# Patient Record
Sex: Female | Born: 1985 | Race: Black or African American | Hispanic: No | Marital: Single | Smoking: Never smoker
Health system: Southern US, Community
[De-identification: ages and names within clinical notes are randomized; demographics above are authoritative.]

## PROBLEM LIST (undated history)

## (undated) DIAGNOSIS — J45909 Unspecified asthma, uncomplicated: Secondary | ICD-10-CM

## (undated) DIAGNOSIS — F419 Anxiety disorder, unspecified: Secondary | ICD-10-CM

## (undated) DIAGNOSIS — G43909 Migraine, unspecified, not intractable, without status migrainosus: Secondary | ICD-10-CM

## (undated) DIAGNOSIS — IMO0002 Reserved for concepts with insufficient information to code with codable children: Secondary | ICD-10-CM

## (undated) DIAGNOSIS — F32A Depression, unspecified: Secondary | ICD-10-CM

## (undated) DIAGNOSIS — K219 Gastro-esophageal reflux disease without esophagitis: Secondary | ICD-10-CM

## (undated) HISTORY — DX: Anxiety disorder, unspecified: F41.9

## (undated) HISTORY — PX: TONSILLECTOMY: SUR1361

## (undated) HISTORY — DX: Migraine, unspecified, not intractable, without status migrainosus: G43.909

## (undated) HISTORY — DX: Depression, unspecified: F32.A

## (undated) HISTORY — DX: Reserved for concepts with insufficient information to code with codable children: IMO0002

## (undated) HISTORY — PX: DENTAL RESTORATION/EXTRACTION WITH X-RAY: SHX5796

---

## 2009-07-11 DIAGNOSIS — IMO0002 Reserved for concepts with insufficient information to code with codable children: Secondary | ICD-10-CM

## 2009-07-11 HISTORY — PX: COLPOSCOPY: SHX161

## 2009-07-11 HISTORY — DX: Reserved for concepts with insufficient information to code with codable children: IMO0002

## 2010-06-11 ENCOUNTER — Ambulatory Visit: Payer: Self-pay | Admitting: Gynecology

## 2010-11-11 ENCOUNTER — Ambulatory Visit: Payer: Self-pay | Admitting: Gynecology

## 2010-11-15 ENCOUNTER — Ambulatory Visit (INDEPENDENT_AMBULATORY_CARE_PROVIDER_SITE_OTHER): Payer: 59 | Admitting: Gynecology

## 2010-11-15 DIAGNOSIS — O200 Threatened abortion: Secondary | ICD-10-CM

## 2010-11-29 ENCOUNTER — Inpatient Hospital Stay (HOSPITAL_COMMUNITY)
Admission: AD | Admit: 2010-11-29 | Discharge: 2010-11-29 | Disposition: A | Discharge status: Home / Self Care | Payer: 59 | Source: Ambulatory Visit | Attending: Obstetrics & Gynecology | Admitting: Obstetrics & Gynecology

## 2010-11-29 ENCOUNTER — Inpatient Hospital Stay (HOSPITAL_COMMUNITY): Payer: 59

## 2010-11-29 DIAGNOSIS — O209 Hemorrhage in early pregnancy, unspecified: Secondary | ICD-10-CM | POA: Insufficient documentation

## 2010-11-29 LAB — URINALYSIS, ROUTINE W REFLEX MICROSCOPIC
Glucose, UA: NEGATIVE mg/dL
Ketones, ur: NEGATIVE mg/dL
Protein, ur: NEGATIVE mg/dL
Urobilinogen, UA: 0.2 mg/dL (ref 0.0–1.0)

## 2010-11-29 LAB — CBC
HCT: 31.3 % — ABNORMAL LOW (ref 36.0–46.0)
MCV: 89.7 fL (ref 78.0–100.0)
Platelets: 316 10*3/uL (ref 150–400)
RBC: 3.49 MIL/uL — ABNORMAL LOW (ref 3.87–5.11)
RDW: 12.6 % (ref 11.5–15.5)
WBC: 9.6 10*3/uL (ref 4.0–10.5)

## 2010-11-29 LAB — WET PREP, GENITAL: Yeast Wet Prep HPF POC: NONE SEEN

## 2010-11-29 LAB — URINE MICROSCOPIC-ADD ON

## 2010-11-30 LAB — GC/CHLAMYDIA PROBE AMP, GENITAL: Chlamydia, DNA Probe: NEGATIVE

## 2010-11-30 LAB — URINE CULTURE
Colony Count: NO GROWTH
Culture: NO GROWTH

## 2010-11-30 LAB — RH IMMUNE GLOBULIN WORKUP (NOT WOMEN'S HOSP)
Antibody Screen: NEGATIVE
Unit division: 0

## 2010-12-02 ENCOUNTER — Other Ambulatory Visit: Payer: Self-pay | Admitting: Obstetrics & Gynecology

## 2010-12-02 ENCOUNTER — Inpatient Hospital Stay (HOSPITAL_COMMUNITY)
Admission: AD | Admit: 2010-12-02 | Discharge: 2010-12-03 | DRG: 779 | Disposition: A | Discharge status: Home / Self Care | Payer: 59 | Source: Ambulatory Visit | Attending: Obstetrics & Gynecology | Admitting: Obstetrics & Gynecology

## 2010-12-02 DIAGNOSIS — D62 Acute posthemorrhagic anemia: Secondary | ICD-10-CM | POA: Diagnosis present

## 2010-12-02 DIAGNOSIS — O99019 Anemia complicating pregnancy, unspecified trimester: Secondary | ICD-10-CM | POA: Diagnosis present

## 2010-12-02 DIAGNOSIS — O4590 Premature separation of placenta, unspecified, unspecified trimester: Secondary | ICD-10-CM | POA: Diagnosis present

## 2010-12-02 DIAGNOSIS — O021 Missed abortion: Principal | ICD-10-CM | POA: Diagnosis present

## 2010-12-02 DIAGNOSIS — O4100X0 Oligohydramnios, unspecified trimester, not applicable or unspecified: Secondary | ICD-10-CM | POA: Diagnosis present

## 2010-12-02 LAB — CBC
HCT: 31.3 % — ABNORMAL LOW (ref 36.0–46.0)
Hemoglobin: 10.7 g/dL — ABNORMAL LOW (ref 12.0–15.0)
MCH: 30.4 pg (ref 26.0–34.0)
MCHC: 34.2 g/dL (ref 30.0–36.0)
MCV: 88.9 fL (ref 78.0–100.0)
RDW: 12.7 % (ref 11.5–15.5)

## 2010-12-02 LAB — RPR: RPR Ser Ql: NONREACTIVE

## 2010-12-02 LAB — RUBELLA SCREEN: Rubella: 73 IU/mL — ABNORMAL HIGH

## 2010-12-02 LAB — HEPATITIS B SURFACE ANTIGEN: Hepatitis B Surface Ag: NEGATIVE

## 2010-12-02 LAB — TYPE AND SCREEN
ABO/RH(D): O NEG
Antibody Screen: POSITIVE
DAT, IgG: NEGATIVE

## 2010-12-02 LAB — RAPID HIV SCREEN (WH-MAU): Rapid HIV Screen: NONREACTIVE

## 2010-12-03 LAB — CBC
Platelets: 283 10*3/uL (ref 150–400)
RBC: 3.18 MIL/uL — ABNORMAL LOW (ref 3.87–5.11)
RDW: 12.7 % (ref 11.5–15.5)
WBC: 13.2 10*3/uL — ABNORMAL HIGH (ref 4.0–10.5)

## 2010-12-08 NOTE — Discharge Summary (Addendum)
[name], [name]                ACCOUNT NO.:  [id number]  MEDICAL RECORD NO.:  [id number]           PATIENT TYPE:  I  LOCATION:  9307                          FACILITY:  [facility]  PHYSICIAN:  Genia Del, M.D.DATE OF BIRTH:  September 19, 1985  DATE OF ADMISSION:  12/02/2010 DATE OF DISCHARGE:  12/03/2010                              DISCHARGE SUMMARY   ADMISSION DIAGNOSES: 1. Second trimester bleeding at 19 weeks. 2. Abruptio placenta with premature rupture of fetal membranes. 3. Severe oligohydramnios and preterm labor.  DISCHARGE DIAGNOSIS:  Status post vaginal delivery of nonviable infant at 19 weeks.  The patient is a 25 year old female who presented to [facility] at 8 weeks' gestation for prenatal care.  PRENATAL LABS:  Include the patient is HIV negative, hepatitis B negative, RPR negative, rubella immune, ABO Rh negative.  The patient presented late to our office for prenatal care, had confirmed pregnancy approximately 1 month ago at an Urgent [facility] and then presented to [facility] Admissions Unit on Monday with complaint of contractions.  Ultrasound at that time revealed a 19-week intrauterine pregnancy, viable.  The patient continued to have worsening pain, heavier bleeding, and then presented to our office on Dec 02, 2010.  On exam, it was noted to have fetal heart rate of 180 beats per minute.  Cervix was effaced 50% and dilated, a fingertip.  Fetal position at that time was noted to be vertex, was then urgently sent over to [facility] with plan to facilitate delivery or to offer the patient D and E.  The patient eventually elected to deliver fetus. Labor was augmented with Cytotec and pain was well controlled.  A nonviable female infant was delivered at 10:15 p.m. on Dec 02, 2010, by Dr. Darryl Nestle.  Admission labs on Dec 02, 2010, WBC 12.0, hemoglobin 10.7, hematocrit 31.3, and platelet count 333.  The patient did well overnight.  Pain is well  controlled.  She is tolerating fluids. Affect is calm and appropriate.  She says she has been occasionally tearful but feels that she has appropriate emotional support in her family, the father of baby, and chaplain.  Postpartum day #1 labs on Dec 03, 2010, WBC 13.2, hemoglobin 9.5, hematocrit 28.6, platelets 283.  The patient has agreed for discharge today and is stable for discharge.  MEDICATIONS AT THE TIME OF DISCHARGE: 1. Ibuprofen 600 mg p.o. q.6 hours p.r.n. pain, dispensed #30, no     refill. 2. Percocet 5/325, 1-2 p.o. q.6 hours p.r.n. pain, dispensed #10. 3. Xanax 0.5 mg p.o. q.8 hours p.r.n. pain, dispensed #20, no refill. 4. Ferralet 90 1 tab p.o. daily #30, no refill.  The patient had been advised to follow up for 4-week visit with Dr. Seymour Bars.  The patient also has been advised to stay out of work for 1 week, encouraged to increase p.o. fluids, and to progress activity on a daily basis.     Arlana Lindau, NP   ______________________________ Genia Del, M.D.    JF/MEDQ  D:  12/03/2010  T:  12-19-10  Job:  [id number]  Electronically Signed by Arlana Lindau  on 12/08/2010 05:00:57  PM Electronically Signed by Genia Del M.D. on 12/08/2010 05:04:38 PM

## 2010-12-10 DEATH — deceased

## 2011-11-17 ENCOUNTER — Ambulatory Visit (INDEPENDENT_AMBULATORY_CARE_PROVIDER_SITE_OTHER): Payer: 59 | Admitting: Family Medicine

## 2011-11-17 VITALS — BP 131/81 | HR 69 | Temp 98.5°F | Resp 18 | Ht 67.0 in | Wt 208.0 lb

## 2011-11-17 DIAGNOSIS — G43809 Other migraine, not intractable, without status migrainosus: Secondary | ICD-10-CM

## 2011-11-17 MED ORDER — RIZATRIPTAN BENZOATE 5 MG PO TABS: 5.0000 mg | ORAL_TABLET | ORAL | Status: DC | PRN

## 2011-11-17 NOTE — Patient Instructions (Signed)

## 2011-11-17 NOTE — Progress Notes (Signed)
Subjective: Patient was with some friends yesterday afternoon. She triggered over to a mall and was coming back. Has not had any alcohol. Started developing a severe headache. About 10 or 15 minutes later she got very nauseated. She cannot tolerate the light, had to keep her eyes closed. She felt terrible and, feeling somewhat dizzy. She did get into her own vehicle.  HA persisted.  O:  HEENT normal.  Perrla. Fundi benign.  Neuro normal. rhomberg negative. Chest CTA . Heart RRR.  Abd. Normal  A:  Migraine variant.  Plan:

## 2011-11-18 ENCOUNTER — Ambulatory Visit: Payer: 59

## 2011-11-18 ENCOUNTER — Ambulatory Visit (INDEPENDENT_AMBULATORY_CARE_PROVIDER_SITE_OTHER): Payer: 59 | Admitting: Family Medicine

## 2011-11-18 ENCOUNTER — Encounter: Payer: Self-pay | Admitting: Family Medicine

## 2011-11-18 DIAGNOSIS — R079 Chest pain, unspecified: Secondary | ICD-10-CM

## 2011-11-18 DIAGNOSIS — R0602 Shortness of breath: Secondary | ICD-10-CM

## 2011-11-18 MED ORDER — GI COCKTAIL ~~LOC~~
30.0000 mL | Freq: Once | ORAL | Status: AC
  Administered 2011-11-18: 30 mL via ORAL

## 2011-11-18 NOTE — Progress Notes (Signed)
Patient Name: Erin [name] Date of Birth: November 01, 1985 Medical Record Number: [id number] Gender: female Date of Encounter: 11/18/2011  History of Present Illness:  Erin [name] is a 26 y.o. very pleasant female patient who presents with the following:  Noted some CP this morning which started around 9:30 am.  It is "dying down" now but not completely gone yet.  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  She did feel like maybe she was "having heart burn".  She has not had CP in the past  Her brother passed away 2 weeks ago at the age of 65- he had a DVT due to a leg injury that turned into a PE.  Her father also had a DVT/PE a couple of years ago- he took coumadin for 6 months and it resolved.  He saw hematology and had a totally negative w/u.    No family history of CAD/ MI.  Erin [name] smokes MJ about once a week, but does not smoke cigarettes and does not use other drugs such as cocaine.  She is not taking any hormones such as OCPs, she has no history of PE/ DVT, and no recent immobilization.   Erin [name] is otherwise generally healthy.  She was seen here yesterday for a HA- migraine-but this is better now.    LMP was 10/29/11.    There is no problem list on file for this patient.  No past medical history on file. No past surgical history on file. History  Substance Use Topics  . Smoking status: Never Smoker   . Smokeless tobacco: Not on file  . Alcohol Use: Not on file   No family history on file. No Known Allergies  Medication list has been reviewed and updated.  Review of Systems: As per HPI- otherwise negative.  Physical Examination: Filed Vitals:   11/18/11 1035  BP: 136/74  Pulse: 94  Temp: 98.1 F (36.7 C)  TempSrc: Oral  Resp: 18  Height: 5\' 7"  (1.702 m)  Weight: 209 lb (94.802 kg)  SpO2: 98%    Body mass index is 32.73 kg/(m^2).  GEN: WDWN, NAD, Non-toxic, A &  O x 3- looks very well HEENT: Atraumatic, Normocephalic. Neck supple. No masses, No LAD.  Tm wnl, oropharynx wnl, PEERL Ears and Nose: No external deformity. CV: RRR, No M/G/R. No JVD. No thrill. No extra heart sounds.  Cannot reproduce CP by pressing on her chest PULM: CTA B, no wheezes, crackles, rhonchi. No retractions. No resp. distress. No accessory muscle use. ABD: S, NT, ND, +BS. No rebound. No HSM. EXTR: No c/c/e NEURO Normal gait.  PSYCH: Normally interactive. Conversant. Not depressed or anxious appearing.  Calm demeanor.  No calf swelling or tenderness/ redness  EKG: normal except for down-going T in III.  No concerning changes or ST elevation or depression  [facility] reading (PRIMARY) by  Dr. Patsy Lager.  Negative CXR Gave a GI cocktail- Erin [name] reported that she "felt much, much better" and that her symptoms were virtually resolved after this.   Assessment and Plan: 1. Chest pain  gi cocktail (Maalox,Lidocaine,Donnatal), Ambulatory referral to Cardiology  2. SOB (shortness of breath)  DG Chest 2 View, Ambulatory referral to Cardiology   Suspect that Erin [name]'s symptoms are due to GERD, likely precipitated by stress due to the recent death of her brother.  Will refer her to see cardiology next week for follow- up, but do  not think that she is having cardiac or pulmonary CP.  However, gave strict return/ ED precautions.  She will seek care right away if her symptoms return or get worse.  She agreed and felt comfortable with the plan

## 2012-05-05 IMAGING — US US OB COMP +14 WK
1 series · 12 of 28 positions shown · non-contrast
Comparison: none

[Series 1: us ob comp +14 wk · 12 of 101 slices shown]
[im 4/101]
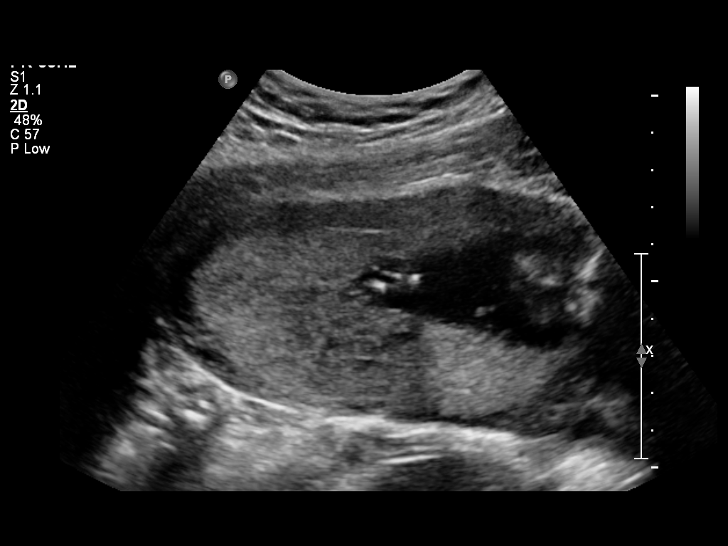
[im 12/101]
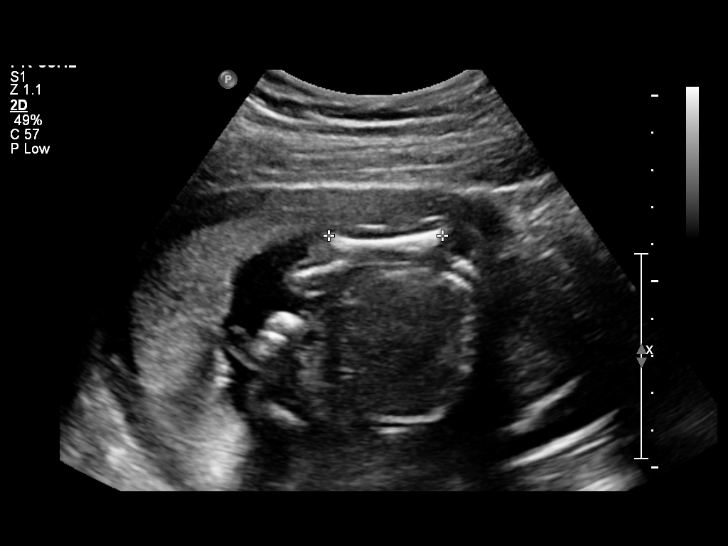
[im 19/101]
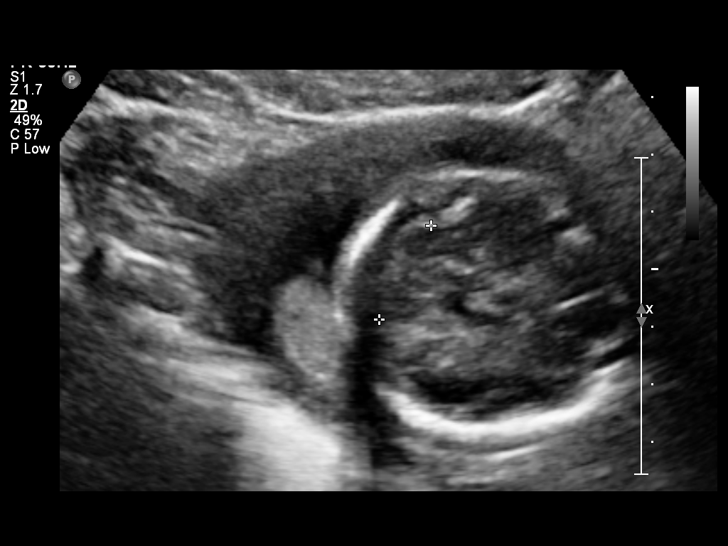
[im 30/101]
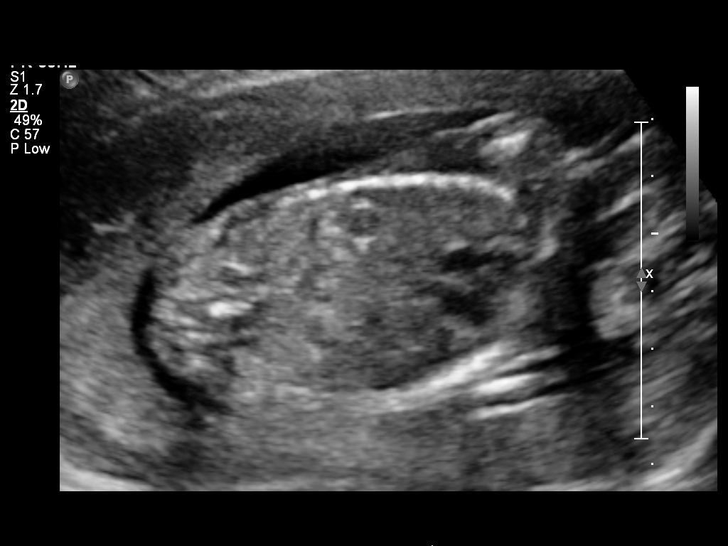
[im 38/101]
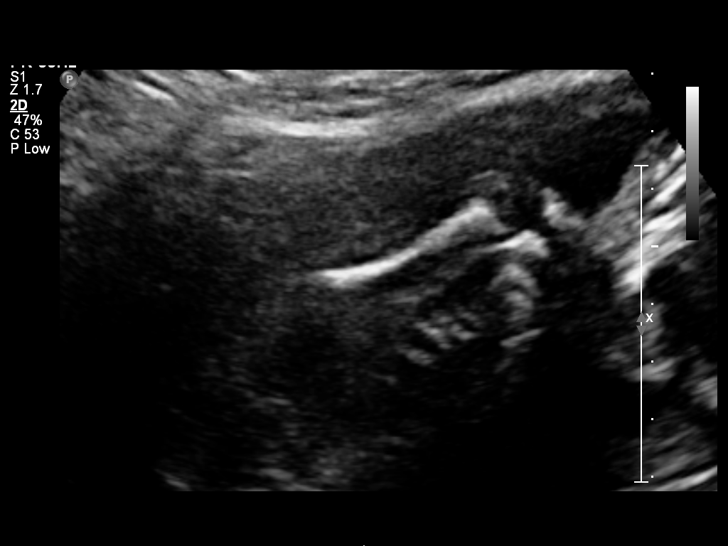
[im 45/101]
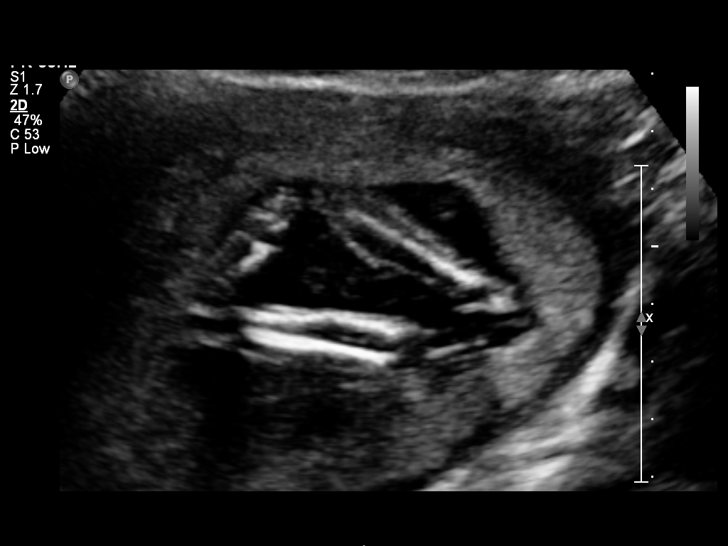
[im 56/101]
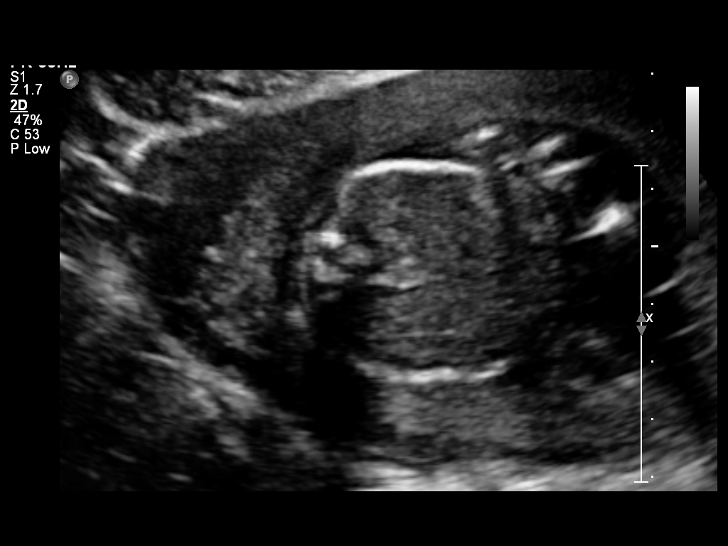
[im 63/101]
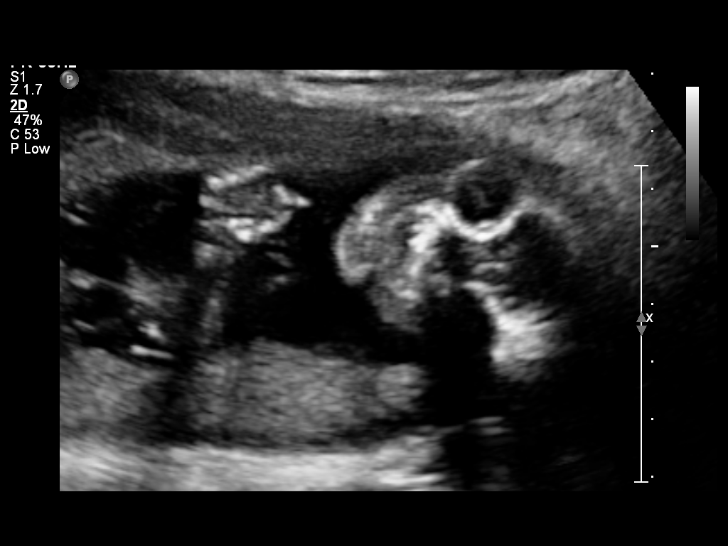
[im 71/101]
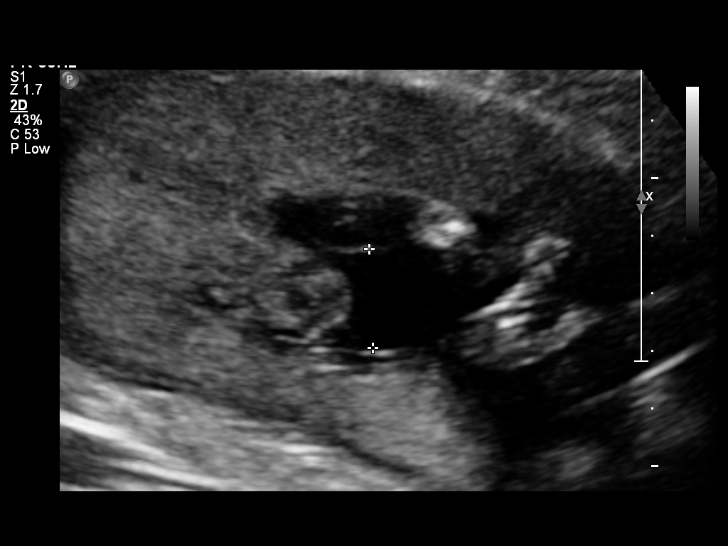
[im 82/101]
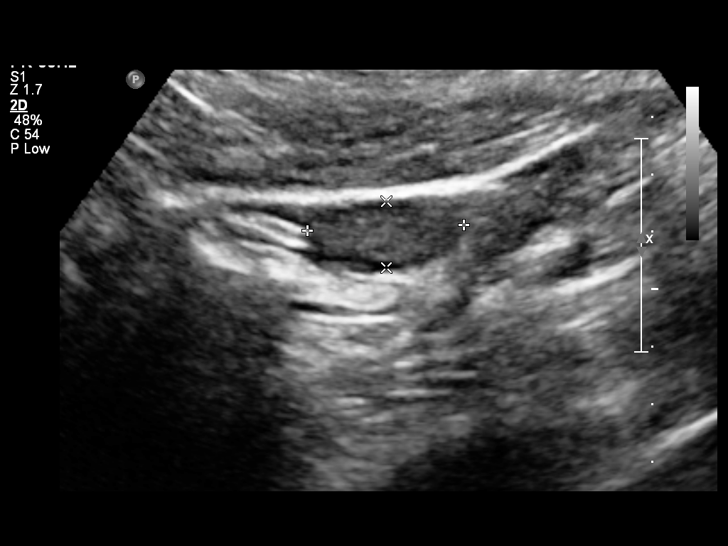
[im 89/101]
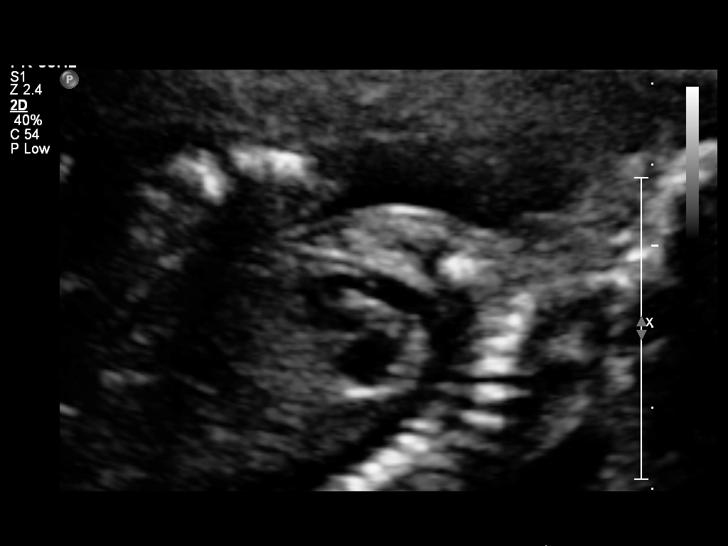
[im 97/101]
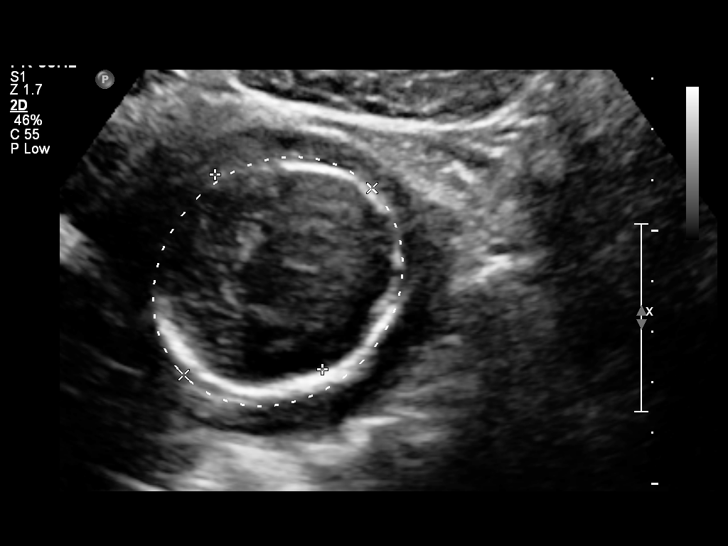

[12 of 28 positions shown; findings below may reference images not displayed]

OBSTETRICS REPORT
                      (Signed Final 11/29/2010 [DATE])

                 MAU/Triage
Procedures

 US OB COMP + 14 WK                                    76805.1
Indications

 Vaginal bleeding, unknown etiology
 Pain - Abdominal/Pelvic
Fetal Evaluation

 Fetal Heart Rate:  152                          bpm
 Cardiac Activity:  Observed
 Presentation:      Cephalic
 Placenta:          Posterior Fundal, above
                    cervical os

 Amniotic Fluid
 AFI FV:      Subjectively decreased
 AFI Sum:     8.24    cm      < 3  %Tile
Biometry

 BPD:     43.5  mm     G. Age:  19w 1d                CI:        76.05   70 - 86
                                                      FL/HC:      19.6   16.1 -

 HC:     158.1  mm     G. Age:  18w 5d       18  %    HC/AC:      1.09   1.09 -

 AC:     144.4  mm     G. Age:  19w 5d       61  %    FL/BPD:
 FL:        31  mm     G. Age:  19w 4d       54  %    FL/AC:      21.5   20 - 24
 HUM:     30.6  mm     G. Age:  20w 1d       73  %
 CER:     19.1  mm     G. Age:  18w 4d       30  %

 Est. FW:     301  gm    0 lb 11 oz      51  %
Gestational Age

 LMP:           13w 0d        Date:  08/30/10                 EDD:   06/06/11
 U/S Today:     19w 2d                                        EDD:   04/23/11
 Best:          19w 2d     Det. By:  U/S (11/29/10)           EDD:   04/23/11
Anatomy
 Cranium:           Appears normal      Aortic Arch:       Not well
                                                           visualized
 Fetal Cavum:       Appears normal      Ductal Arch:       Appears normal
 Ventricles:        Appears normal      Diaphragm:         Not well
                                                           visualized
 Choroid Plexus:    Appears normal      Stomach:           Appears normal
 Cerebellum:        Appears normal      Abdomen:           Appears normal
 Posterior Fossa:   Appears normal      Abdominal Wall:    Appears nml
                                                           (cord insert,
                                                           abd wall)
 Nuchal Fold:       Not well            Cord Vessels:      Appears normal
                    visualized                             (3 vessel cord)
 Face:              Appears normal      Kidneys:           Appear normal
                    (lips/profile/orbit
                    s)
 Heart:             Appears normal      Bladder:           Appears normal
                    (4 chamber &
                    axis)
 RVOT:              Appears normal      Spine:             Appears normal
 LVOT:              Appears normal      Limbs:             Four extremities
                                                           seen

 Other:     Technically difficult due to  maternal habitus.
Cervix Uterus Adnexa

 Cervical Length:    3.4      cm

 Cervix:       Normal appearance by transabdominal scan.
 Left Ovary:    Within normal limits.
 Right Ovary:   Within normal limits.
 Comment:    No placental abruption or previa identified.
Impression

 Single living IUP with US Gest. Age of 19w 2d, and EDD of
 04/23/2011.  This is 6 wks ahead of LMP.
 Suboptimal visualization of fetal anatomy, however no fetal
 anomalies identified.
 Amniotic fluid volume is subjectively decreased, with AFI of
 8.24 cm (<3 %ile).
 Normal cervical length. No placental abruption or previa
 identifed.

 questions or concerns.

## 2012-06-02 ENCOUNTER — Ambulatory Visit (INDEPENDENT_AMBULATORY_CARE_PROVIDER_SITE_OTHER): Payer: 59 | Admitting: Family Medicine

## 2012-06-02 VITALS — BP 150/80 | HR 82 | Temp 98.2°F | Resp 16 | Ht 68.0 in | Wt 219.0 lb

## 2012-06-02 DIAGNOSIS — G501 Atypical facial pain: Secondary | ICD-10-CM

## 2012-06-02 DIAGNOSIS — R51 Headache: Secondary | ICD-10-CM

## 2012-06-02 LAB — POCT SEDIMENTATION RATE: POCT SED RATE: 10 mm/h (ref 0–22)

## 2012-06-02 LAB — POCT CBC
Granulocyte percent: 45.1 %G (ref 37–80)
HCT, POC: 42.6 % (ref 37.7–47.9)
Hemoglobin: 13.4 g/dL (ref 12.2–16.2)
Lymph, poc: 2.2 (ref 0.6–3.4)
MCH, POC: 29.5 pg (ref 27–31.2)
MCHC: 31.5 g/dL — AB (ref 31.8–35.4)
MCV: 93.7 fL (ref 80–97)
MID (cbc): 0.3 (ref 0–0.9)
MPV: 8.2 fL (ref 0–99.8)
POC Granulocyte: 2 (ref 2–6.9)
POC LYMPH PERCENT: 48 %L (ref 10–50)
POC MID %: 6.9 % (ref 0–12)
Platelet Count, POC: 342 10*3/uL (ref 142–424)
RBC: 4.55 M/uL (ref 4.04–5.48)
RDW, POC: 12 %
WBC: 4.5 10*3/uL — AB (ref 4.6–10.2)

## 2012-06-02 MED ORDER — IBUPROFEN 800 MG PO TABS: 800.0000 mg | ORAL_TABLET | Freq: Three times a day (TID) | ORAL | Status: DC | PRN

## 2012-06-02 NOTE — Progress Notes (Signed)
[facility]:  Office Visit  Chief Complaint:  Chief Complaint  Patient presents with  . Headache    pain on right side of head started last night    HPI: Erin [name] is a 26 y.o. female who complains of  Sharp pain on right side of temporal area, frontal area, and swelling. Was constant. This AM it  is slighty better. After she starts getting up and moving. No rashes. No fevers, chills. No throat infections, no ear problems. Feels like pressure on eye but not double or blurred vision. No confusion, no gait changes. Took Aleve and was able to help with sleep. She has HA every time she "thinks very hard"  Headaches coming pre and post menstruation. Taking Aleve and tylenol which helps.   History reviewed. No pertinent past medical history. History reviewed. No pertinent past surgical history. History   Social History  . Marital Status: Single    Spouse Name: N/A    Number of Children: N/A  . Years of Education: N/A   Social History Main Topics  . Smoking status: Never Smoker   . Smokeless tobacco: None  . Alcohol Use: No  . Drug Use: None  . Sexually Active: None   Other Topics Concern  . None   Social History Narrative  . None   Family History  Problem Relation Age of Onset  . Hypertension Mother    No Known Allergies Prior to Admission medications   Medication Sig Start Date End Date Taking? Authorizing Provider  rizatriptan (MAXALT) 5 MG tablet Take 1 tablet (5 mg total) by mouth as needed for migraine. May repeat in 2 hours if needed 11/17/11 11/16/12  Peyton Najjar, MD     ROS: The patient denies fevers, chills, night sweats, unintentional weight loss, chest pain, palpitations, wheezing, dyspnea on exertion, nausea, vomiting, abdominal pain, dysuria, hematuria, melena, numbness, weakness, or tingling.   All other systems have been reviewed and were otherwise negative with the exception of those mentioned in the HPI and as above.    PHYSICAL  EXAM: Filed Vitals:   06/02/12 1523  BP: 150/80  Pulse: 82  Temp: 98.2 F (36.8 C)  Resp: 16   There were no vitals filed for this visit. There is no height or weight on file to calculate BMI.  General: Alert, no acute distress HEENT:  Normocephalic, atraumatic, oropharynx patent. EOMI, PERRLa, fundoscopic exam nl, TM nl, neck ROM nl, neg Spurling. No temproal tenderness Cardiovascular:  Regular rate and rhythm, no rubs murmurs or gallops.  No Carotid bruits, radial pulse intact. No pedal edema.  Respiratory: Clear to auscultation bilaterally.  No wheezes, rales, or rhonchi.  No cyanosis, no use of accessory musculature GI: No organomegaly, abdomen is soft and non-tender, positive bowel sounds.  No masses. Skin: No rashes. Neurologic: Facial musculature symmetric. Psychiatric: Patient is appropriate throughout our interaction. Lymphatic: No cervical lymphadenopathy Musculoskeletal: Gait intact.   LABS: Results for orders placed in visit on 06/02/12  POCT CBC      Component Value Range   WBC 4.5 (*) 4.6 - 10.2 K/uL   Lymph, poc 2.2  0.6 - 3.4   POC LYMPH PERCENT 48.0  10 - 50 %L   MID (cbc) 0.3  0 - 0.9   POC MID % 6.9  0 - 12 %M   POC Granulocyte 2.0  2 - 6.9   Granulocyte percent 45.1  37 - 80 %G   RBC 4.55  4.04 -  5.48 M/uL   Hemoglobin 13.4  12.2 - 16.2 g/dL   HCT, POC 16.1  09.6 - 47.9 %   MCV 93.7  80 - 97 fL   MCH, POC 29.5  27 - 31.2 pg   MCHC 31.5 (*) 31.8 - 35.4 g/dL   RDW, POC 04.5     Platelet Count, POC 342  142 - 424 K/uL   MPV 8.2  0 - 99.8 fL     EKG/XRAY:   Primary read interpreted by Dr. Conley Rolls at [facility]   ASSESSMENT/PLAN: Encounter Diagnoses  Name Primary?  . Headache Yes  . Facial pain, atypical    IBuprofen 800 mg PO q8 hrs with food.  ESR pending Will monitor for worsening s/sx F/u in 48-72 hrs by phone Go to ER or F/u prn     Erin Leven PHUONG, DO 06/02/2012 4:24 PM

## 2012-06-04 ENCOUNTER — Telehealth: Payer: Self-pay | Admitting: Family Medicine

## 2012-06-04 NOTE — Telephone Encounter (Signed)
LM that her ESR is normal.

## 2012-08-31 ENCOUNTER — Ambulatory Visit (INDEPENDENT_AMBULATORY_CARE_PROVIDER_SITE_OTHER): Payer: 59 | Admitting: Physician Assistant

## 2012-08-31 VITALS — BP 128/74 | HR 72 | Temp 98.7°F | Resp 18 | Ht 67.0 in | Wt 222.0 lb

## 2012-08-31 DIAGNOSIS — Z832 Family history of diseases of the blood and blood-forming organs and certain disorders involving the immune mechanism: Secondary | ICD-10-CM

## 2012-08-31 DIAGNOSIS — Z309 Encounter for contraceptive management, unspecified: Secondary | ICD-10-CM

## 2012-08-31 DIAGNOSIS — Z124 Encounter for screening for malignant neoplasm of cervix: Secondary | ICD-10-CM

## 2012-08-31 DIAGNOSIS — N898 Other specified noninflammatory disorders of vagina: Secondary | ICD-10-CM

## 2012-08-31 DIAGNOSIS — Z0000 Encounter for general adult medical examination without abnormal findings: Secondary | ICD-10-CM

## 2012-08-31 DIAGNOSIS — Z8742 Personal history of other diseases of the female genital tract: Secondary | ICD-10-CM

## 2012-08-31 DIAGNOSIS — Z113 Encounter for screening for infections with a predominantly sexual mode of transmission: Secondary | ICD-10-CM

## 2012-08-31 LAB — POCT WET PREP WITH KOH
KOH Prep POC: NEGATIVE
RBC Wet Prep HPF POC: NEGATIVE

## 2012-08-31 LAB — POCT CBC
Hemoglobin: 13.3 g/dL (ref 12.2–16.2)
Lymph, poc: 2 (ref 0.6–3.4)
MCH, POC: 30.1 pg (ref 27–31.2)
MCHC: 32.7 g/dL (ref 31.8–35.4)
MID (cbc): 0.3 (ref 0–0.9)
MPV: 7.5 fL (ref 0–99.8)
POC Granulocyte: 2.2 (ref 2–6.9)
POC MID %: 6.9 %M (ref 0–12)
Platelet Count, POC: 396 10*3/uL (ref 142–424)
RBC: 4.42 M/uL (ref 4.04–5.48)
WBC: 4.5 10*3/uL — AB (ref 4.6–10.2)

## 2012-08-31 LAB — COMPREHENSIVE METABOLIC PANEL
ALT: 12 U/L (ref 0–35)
AST: 15 U/L (ref 0–37)
Albumin: 4.6 g/dL (ref 3.5–5.2)
Alkaline Phosphatase: 47 U/L (ref 39–117)
Glucose, Bld: 76 mg/dL (ref 70–99)
Potassium: 4.2 mEq/L (ref 3.5–5.3)
Sodium: 141 mEq/L (ref 135–145)
Total Bilirubin: 0.5 mg/dL (ref 0.3–1.2)
Total Protein: 7.2 g/dL (ref 6.0–8.3)

## 2012-08-31 LAB — POCT URINALYSIS DIPSTICK
Blood, UA: NEGATIVE
Leukocytes, UA: NEGATIVE
Nitrite, UA: NEGATIVE
Urobilinogen, UA: 0.2
pH, UA: 8

## 2012-08-31 LAB — POCT URINE PREGNANCY: Preg Test, Ur: NEGATIVE

## 2012-08-31 LAB — TSH: TSH: 1.776 u[IU]/mL (ref 0.350–4.500)

## 2012-08-31 MED ORDER — DESOGESTREL-ETHINYL ESTRADIOL 0.15-30 MG-MCG PO TABS: 1.0000 | ORAL_TABLET | Freq: Every day | ORAL | Status: DC

## 2012-08-31 NOTE — Patient Instructions (Addendum)
All of your labs look normal today.  I will let you know when I have the rest of them back.  I have sent Apri to the pharmacy.  Be sure to take this EVERY DAY!  Additionally you need to use condoms every time.  This is the only way to prevent infection.  I do think it would be a good idea to be evaluated by hematology to make sure you are not at increased risk for blood clot.   Health Maintenance, Females A healthy lifestyle and preventative care can promote health and wellness.  Maintain regular health, dental, and eye exams.  Eat a healthy diet. Foods like vegetables, fruits, whole grains, low-fat dairy products, and lean protein foods contain the nutrients you need without too many calories. Decrease your intake of foods high in solid fats, added sugars, and salt. Get information about a proper diet from your caregiver, if necessary.  Regular physical exercise is one of the most important things you can do for your health. Most adults should get at least 150 minutes of moderate-intensity exercise (any activity that increases your heart rate and causes you to sweat) each week. In addition, most adults need muscle-strengthening exercises on 2 or more days a week.   Maintain a healthy weight. The body mass index (BMI) is a screening tool to identify possible weight problems. It provides an estimate of body fat based on height and weight. Your caregiver can help determine your BMI, and can help you achieve or maintain a healthy weight. For adults 20 years and older:  A BMI below 18.5 is considered underweight.  A BMI of 18.5 to 24.9 is normal.  A BMI of 25 to 29.9 is considered overweight.  A BMI of 30 and above is considered obese.  Maintain normal blood lipids and cholesterol by exercising and minimizing your intake of saturated fat. Eat a balanced diet with plenty of fruits and vegetables. Blood tests for lipids and cholesterol should begin at age 67 and be repeated every 5 years. If your  lipid or cholesterol levels are high, you are over 50, or you are a high risk for heart disease, you may need your cholesterol levels checked more frequently.Ongoing high lipid and cholesterol levels should be treated with medicines if diet and exercise are not effective.  If you smoke, find out from your caregiver how to quit. If you do not use tobacco, do not start.  If you are pregnant, do not drink alcohol. If you are breastfeeding, be very cautious about drinking alcohol. If you are not pregnant and choose to drink alcohol, do not exceed 1 drink per day. One drink is considered to be 12 ounces (355 mL) of beer, 5 ounces (148 mL) of wine, or 1.5 ounces (44 mL) of liquor.  Avoid use of street drugs. Do not share needles with anyone. Ask for help if you need support or instructions about stopping the use of drugs.  High blood pressure causes heart disease and increases the risk of stroke. Blood pressure should be checked at least every 1 to 2 years. Ongoing high blood pressure should be treated with medicines, if weight loss and exercise are not effective.  If you are 2 to 25 years old, ask your caregiver if you should take aspirin to prevent strokes.  Diabetes screening involves taking a blood sample to check your fasting blood sugar level. This should be done once every 3 years, after age 71, if you are within normal weight and  without risk factors for diabetes. Testing should be considered at a younger age or be carried out more frequently if you are overweight and have at least 1 risk factor for diabetes.  Breast cancer screening is essential preventative care for women. You should practice "breast self-awareness." This means understanding the normal appearance and feel of your breasts and may include breast self-examination. Any changes detected, no matter how small, should be reported to a caregiver. Women in their 106s and 30s should have a clinical breast exam (CBE) by a caregiver as part of  a regular health exam every 1 to 3 years. After age 26, women should have a CBE every year. Starting at age 50, women should consider having a mammogram (breast X-ray) every year. Women who have a family history of breast cancer should talk to their caregiver about genetic screening. Women at a high risk of breast cancer should talk to their caregiver about having an MRI and a mammogram every year.  The Pap test is a screening test for cervical cancer. Women should have a Pap test starting at age 63. Between ages 30 and 82, Pap tests should be repeated every 2 years. Beginning at age 4, you should have a Pap test every 3 years as long as the past 3 Pap tests have been normal. If you had a hysterectomy for a problem that was not cancer or a condition that could lead to cancer, then you no longer need Pap tests. If you are between ages 47 and 76, and you have had normal Pap tests going back 10 years, you no longer need Pap tests. If you have had past treatment for cervical cancer or a condition that could lead to cancer, you need Pap tests and screening for cancer for at least 20 years after your treatment. If Pap tests have been discontinued, risk factors (such as a new sexual partner) need to be reassessed to determine if screening should be resumed. Some women have medical problems that increase the chance of getting cervical cancer. In these cases, your caregiver may recommend more frequent screening and Pap tests.  The human papillomavirus (HPV) test is an additional test that may be used for cervical cancer screening. The HPV test looks for the virus that can cause the cell changes on the cervix. The cells collected during the Pap test can be tested for HPV. The HPV test could be used to screen women aged 9 years and older, and should be used in women of any age who have unclear Pap test results. After the age of 65, women should have HPV testing at the same frequency as a Pap test.  Colorectal cancer  can be detected and often prevented. Most routine colorectal cancer screening begins at the age of 89 and continues through age 16. However, your caregiver may recommend screening at an earlier age if you have risk factors for colon cancer. On a yearly basis, your caregiver may provide home test kits to check for hidden blood in the stool. Use of a small camera at the end of a tube, to directly examine the colon (sigmoidoscopy or colonoscopy), can detect the earliest forms of colorectal cancer. Talk to your caregiver about this at age 23, when routine screening begins. Direct examination of the colon should be repeated every 5 to 10 years through age 16, unless early forms of pre-cancerous polyps or small growths are found.  Hepatitis C blood testing is recommended for all people born from [birth year] through 1963  and any individual with known risks for hepatitis C.  Practice safe sex. Use condoms and avoid high-risk sexual practices to reduce the spread of sexually transmitted infections (STIs). Sexually active women aged 82 and younger should be checked for Chlamydia, which is a common sexually transmitted infection. Older women with new or multiple partners should also be tested for Chlamydia. Testing for other STIs is recommended if you are sexually active and at increased risk.  Osteoporosis is a disease in which the bones lose minerals and strength with aging. This can result in serious bone fractures. The risk of osteoporosis can be identified using a bone density scan. Women ages 73 and over and women at risk for fractures or osteoporosis should discuss screening with their caregivers. Ask your caregiver whether you should be taking a calcium supplement or vitamin D to reduce the rate of osteoporosis.  Menopause can be associated with physical symptoms and risks. Hormone replacement therapy is available to decrease symptoms and risks. You should talk to your caregiver about whether hormone replacement  therapy is right for you.  Use sunscreen with a sun protection factor (SPF) of 30 or greater. Apply sunscreen liberally and repeatedly throughout the day. You should seek shade when your shadow is shorter than you. Protect yourself by wearing long sleeves, pants, a wide-brimmed hat, and sunglasses year round, whenever you are outdoors.  Notify your caregiver of new moles or changes in moles, especially if there is a change in shape or color. Also notify your caregiver if a mole is larger than the size of a pencil eraser.  Stay current with your immunizations. Document Released: 01/10/2011 Document Revised: 09/19/2011 Document Reviewed: 01/10/2011 [facility] Patient Information 2013 [place], [place].

## 2012-08-31 NOTE — Progress Notes (Signed)
Subjective:    Patient ID: Erin [name], female    DOB: 05-28-1986, 27 y.o.   MRN: [id number]  HPI   Ms. Heming is a 27 yr old female here for CPE, pap, contraception management  States she has been experiencing vaginal discharge "for awhile".   Had a miscarriage approx 1 yr ago, never went back for check up, feels like she has had discharge since then.  Discharge is somewhat think and has an odor.  Denies pelvic or abd pain.  No discomfort with sex.  No urinary symptoms.  She has had both yeast and BV before.    No other concerns.  LMP: 08/19/12, normal, periods every month, pretty heavy though  Contraception:  Just condoms currently, but not every time. About 50% of the time.   Just one female partner. No specific concern for STI, but would like testing; was tested before about a yr ago, all negative.  Was previously using OCPs, but "I didn't take it seriously," ended up becoming pregnant.  Would like to try pills again.  Thinks she will be better about taking them.  Very concerned about weight gain and acne with use of OCPs.    Pap/pelvic/breast/mammo:  Thinks she has had pap within the last yr, does not think it was abnormal.  On review of paper chart, pt with ASCUS and HPV+ 2011 - unclear of follow-up.    Dentist:  About every year  Eye doctor:  Does wear glasses, but hasn't been to eye doctor in awhile  Flu/tdap/gardasil/pneumo:  Not sure if utd on tetanus.  On review of paper chart, last documented Td 2002.  Pt has had full Gardasil series.  Diet:  Trying to get on a diet, does drink lots of sugar sweetened beverages  Exercise:  None  Meds: no meds  Family history:  Mother - htn; Father - arthritis, blood clots; Brother - blood clot (?PE), 3 yrs old, deceased within last 6-7 months, had had surgery on leg  PCP: [facility]    Review of Systems  Genitourinary: Positive for vaginal discharge.  All other systems reviewed and are negative.       Objective:   Physical Exam   Vitals reviewed. Constitutional: She is oriented to person, place, and time. She appears well-developed and well-nourished. No distress.  HENT:  Head: Normocephalic and atraumatic.  Right Ear: Tympanic membrane and ear canal normal.  Left Ear: Tympanic membrane and ear canal normal.  Mouth/Throat: Uvula is midline, oropharynx is clear and moist and mucous membranes are normal.  Eyes: Conjunctivae and EOM are normal. Pupils are equal, round, and reactive to light. No scleral icterus.  Neck: Neck supple. Thyromegaly present.  Cardiovascular: Normal rate, regular rhythm and normal heart sounds.  Exam reveals no gallop and no friction rub.   No murmur heard. Pulmonary/Chest: Effort normal and breath sounds normal. She has no wheezes. She has no rales.  Abdominal: Soft. Bowel sounds are normal. She exhibits no distension and no mass. There is no tenderness. There is no rebound and no guarding.  Genitourinary: Vagina normal and uterus normal. There is no rash, tenderness or lesion on the right labia. There is no rash, tenderness or lesion on the left labia. Cervix exhibits no motion tenderness, no discharge and no friability. Right adnexum displays no mass, no tenderness and no fullness. Left adnexum displays no mass, no tenderness and no fullness. No vaginal discharge found.  Lymphadenopathy:    She has no cervical adenopathy.  Neurological: She  is alert and oriented to person, place, and time.  Skin: Skin is warm and dry.  Psychiatric: She has a normal mood and affect. Her behavior is normal.     Filed Vitals:   08/31/12 1426  BP: 128/74  Pulse: 72  Temp: 98.7 F (37.1 C)  Resp: 18     Results for orders placed in visit on 08/31/12  POCT CBC      Result Value Range   WBC 4.5 (*) 4.6 - 10.2 K/uL   Lymph, poc 2.0  0.6 - 3.4   POC LYMPH PERCENT 45.3  10 - 50 %L   MID (cbc) 0.3  0 - 0.9   POC MID % 6.9  0 - 12 %M   POC Granulocyte 2.2  2 - 6.9   Granulocyte percent 47.8  37 - 80 %G    RBC 4.42  4.04 - 5.48 M/uL   Hemoglobin 13.3  12.2 - 16.2 g/dL   HCT, POC 40.9  81.1 - 47.9 %   MCV 92.1  80 - 97 fL   MCH, POC 30.1  27 - 31.2 pg   MCHC 32.7  31.8 - 35.4 g/dL   RDW, POC 91.4     Platelet Count, POC 396  142 - 424 K/uL   MPV 7.5  0 - 99.8 fL  POCT URINALYSIS DIPSTICK      Result Value Range   Color, UA yellow     Clarity, UA clear     Glucose, UA neg     Bilirubin, UA neg     Ketones, UA trace     Spec Grav, UA 1.020     Blood, UA neg     pH, UA 8.0     Protein, UA 100     Urobilinogen, UA 0.2     Nitrite, UA neg     Leukocytes, UA Negative    POCT WET PREP WITH KOH      Result Value Range   Trichomonas, UA Negative     Clue Cells Wet Prep HPF POC 0-1     Epithelial Wet Prep HPF POC 0-4     Yeast Wet Prep HPF POC neg     Bacteria Wet Prep HPF POC 1+     RBC Wet Prep HPF POC neg     WBC Wet Prep HPF POC 0-1     KOH Prep POC Negative    POCT URINE PREGNANCY      Result Value Range   Preg Test, Ur Negative          Assessment & Plan:  (1) Routine general medical examination at a health care facility - Plan: POCT CBC, POCT urinalysis dipstick, Comprehensive metabolic panel, TSH, Pap IG, CT/NG w/ reflex HPV when ASC-U  - Pt appears to be in good health.  PE is WNL.  UA and CBC are both normal.  Cmet, TSH pending.  (2) Screen for STD (sexually transmitted disease) - Plan: RPR, HIV antibody  - GC/chlamydia sent with pap.  HIV, RPR pending.  Discussed condom use with every encounter.  (3) Vaginal discharge - Plan: POCT Wet Prep with KOH  - No vaginal discharge appreciable on exam.  Wet prep neg for both yeast and clue cells.  CT/GC pending.  (4) Contraception management - Plan: POCT urine pregnancy, Pap IG, CT/NG w/ reflex HPV when ASC-U, desogestrel-ethinyl estradiol (APRI,EMOQUETTE,SOLIA) 0.15-30 MG-MCG tablet  - Discussed contraceptive options with pt.  She would like to try pills again,  thinks she will be better about taking them.  Would like to  resume Apri which she was using before.  Discussed with her that there are many options available that do not require remembering pills every day.  I printed pt edu materials from [employer] on contraception options.  Also discussed with her my concern due to her fam hx of blood clots and personal hx of spont ab.  I think it would be prudent to send to hematology for hypercoagulable workup, esp when starting hormonal contraception.  Per WHO guidelines, fam hx of clot is category 2 risk when starting hormonal contraceptive, which is acceptable, but not the lowest risk level.  Discussed depo shot with pt which would be lower risk, but pt very concerned about weight gain with this.  Will start Apri.  Urine HCG neg today, so will do quick start.  Warned pt about spotting/irregular bleeding.  (5) Screening for cervical cancer - Plan: Pap IG, CT/NG w/ reflex HPV when ASC-U  - Pt with abnormal pap, +HPV in 2011.  Thinks she saw a specialist for this but is not sure.  Pap done today.  Will proceed with referral to OBGYN if necessary.  (6) History of abnormal Pap smear - Plan: Pap IG, CT/NG w/ reflex HPV when ASC-U  - See above  (7) Need for Tdap vaccination - Plan: CANCELED: Tdap vaccine greater than or equal to 7yo IM  - Last tetanus documented in 2002.  Needs Tdap.  Discussed this multiple times during the visit and pt was in agreement.  When assistant went to administer shot, pt refused.  (8) Family history of blood clots - Plan: Ambulatory referral to Hematology  - Brother deceased from PE at 32 yrs old.  Sounds like he may have had a surgery that precipitated this.  Pt has no personal history of clot, but did have spontaneous abortion last yr.  I think it is reasonable to have her evaluated by heme, especially since we are starting hormonal contraceptives.  Pt is somewhat averse to this.  Obviously, it is her decision to pursue this or not.  But I think it would be a good idea, and I have placed the  referral.

## 2012-09-03 ENCOUNTER — Telehealth: Payer: Self-pay | Admitting: Oncology

## 2012-09-03 NOTE — Telephone Encounter (Signed)
S/W pt in re NP appt 03/21 @ 10:30 w/Dr. Gaylyn Rong.  Referring Dr. Debbra Riding Dx- Family Hx of Blood clots  Welcome packet emailed.

## 2012-09-04 LAB — PAP IG, CT-NG, RFX HPV ASCU
Chlamydia Probe Amp: NEGATIVE
GC Probe Amp: NEGATIVE

## 2012-09-05 ENCOUNTER — Other Ambulatory Visit: Payer: Self-pay | Admitting: Physician Assistant

## 2012-09-05 MED ORDER — METRONIDAZOLE 500 MG PO TABS: 500.0000 mg | ORAL_TABLET | Freq: Two times a day (BID) | ORAL | Status: DC

## 2012-09-06 ENCOUNTER — Telehealth: Payer: Self-pay | Admitting: Oncology

## 2012-09-06 ENCOUNTER — Telehealth: Payer: Self-pay | Admitting: Radiology

## 2012-09-06 NOTE — Telephone Encounter (Signed)
Patient advised of lab results

## 2012-09-06 NOTE — Telephone Encounter (Signed)
C/D 09/06/12 for appt. 09/28/12

## 2012-09-24 ENCOUNTER — Other Ambulatory Visit: Payer: Self-pay | Admitting: Oncology

## 2012-09-28 ENCOUNTER — Ambulatory Visit (HOSPITAL_BASED_OUTPATIENT_CLINIC_OR_DEPARTMENT_OTHER): Payer: 59 | Admitting: Oncology

## 2012-09-28 ENCOUNTER — Encounter: Payer: Self-pay | Admitting: Oncology

## 2012-09-28 ENCOUNTER — Ambulatory Visit: Payer: 59

## 2012-09-28 ENCOUNTER — Other Ambulatory Visit (HOSPITAL_BASED_OUTPATIENT_CLINIC_OR_DEPARTMENT_OTHER): Payer: 59 | Admitting: Lab

## 2012-09-28 VITALS — BP 134/81 | HR 77 | Temp 98.0°F | Resp 20 | Ht 66.0 in | Wt 220.9 lb

## 2012-09-28 DIAGNOSIS — Z789 Other specified health status: Secondary | ICD-10-CM

## 2012-09-28 DIAGNOSIS — Z8249 Family history of ischemic heart disease and other diseases of the circulatory system: Secondary | ICD-10-CM

## 2012-09-28 DIAGNOSIS — Z309 Encounter for contraceptive management, unspecified: Secondary | ICD-10-CM

## 2012-09-28 NOTE — Progress Notes (Signed)
[facility]  [phone] [phone]     INITIAL HEMATOLOGY CONSULTATION    Referral MD:  Godfrey Pick, PA-C.   Reason for Referral: taking birth control; purported family history of PE.     HPI:   Erin [name] a 27 year old African American woman with no significant past medical history. She is sexually active. She started on oral birth control. She does not have dysmenorrhagia. Her stepbrother died from pulmonary embolism that was trauma related. She would like to be that if her risk of blood clot while taking birth control. Of note she is smoking 1-2 joints of marijuana a day. She was kindly referred to the cancer Center for evaluation.   Erin [name]  presented to the clinic for the first time by himself.  she reported feeling well. She denied all symptoms.  Patient denies fever, anorexia, weight loss, fatigue, headache, visual changes, confusion, drenching night sweats, palpable lymph node swelling, mucositis, odynophagia, dysphagia, nausea vomiting, jaundice, chest pain, palpitation, shortness of breath, dyspnea on exertion, productive cough, gum bleeding, epistaxis, hematemesis, hemoptysis, abdominal pain, abdominal swelling, early satiety, melena, hematochezia, hematuria, skin rash, spontaneous bleeding, joint swelling, joint pain, heat or cold intolerance, bowel bladder incontinence, back pain, focal motor weakness, paresthesia, depression.    Past Medical History  Diagnosis Date  . Migraine   :    Past Surgical History  Procedure Laterality Date  . Dental restoration/extraction with x-ray    :   CURRENT MEDS: Current Outpatient Prescriptions  Medication Sig Dispense Refill  . desogestrel-ethinyl estradiol (APRI,EMOQUETTE,SOLIA) 0.15-30 MG-MCG tablet Take 1 tablet by mouth daily.  1 Package  3   No current facility-administered medications for this visit.      No Known Allergies:  Family History  Problem Relation Age  of Onset  . Hypertension Mother   :  History   Social History  . Marital Status: Single    Spouse Name: N/A    Number of Children: 0  . Years of Education: N/A   Occupational History  .      cosmotologist, sale   Social History Main Topics  . Smoking status: Never Smoker   . Smokeless tobacco: Not on file  . Alcohol Use: No  . Drug Use: Yes    Special: Marijuana     Comment: marijuana every other day  . Sexually Active: Yes   Other Topics Concern  . Not on file   Social History Narrative  . No narrative on file  :  REVIEW OF SYSTEM:  The rest of the 14-point review of sytem was negative.   Exam: ECOG 0.   General:  well-nourished woman, in no acute distress.  Eyes:  no scleral icterus.  ENT:  There were no oropharyngeal lesions.  Neck was without thyromegaly.  Lymphatics:  Negative cervical, supraclavicular or axillary adenopathy.  Respiratory: lungs were clear bilaterally without wheezing or crackles.  Cardiovascular:  Regular rate and rhythm, S1/S2, without murmur, rub or gallop.  There was no pedal edema.  GI:  abdomen was soft, flat, nontender, nondistended, without organomegaly.  Muscoloskeletal:  no spinal tenderness of palpation of vertebral spine.  Skin exam was without echymosis, petichae.  Neuro exam was nonfocal.  Patient was able to get on and off exam table without assistance.  Gait was normal.  Patient was alert and oriented.  Attention was good.   Language was appropriate.  Mood was normal without depression.  Speech was not pressured.  Thought content was not tangential.    LABS:  Lab Results  Component Value Date   WBC 4.5* 08/31/2012   HGB 13.3 08/31/2012   HCT 40.7 08/31/2012   PLT 283 12/03/2010   GLUCOSE 76 08/31/2012   ALT 12 08/31/2012   AST 15 08/31/2012   NA 141 08/31/2012   K 4.2 08/31/2012   CL 106 08/31/2012   CREATININE 0.83 08/31/2012   BUN 11 08/31/2012   CO2 27 08/31/2012   INR 0.98 12/02/2010     ASSESSMENT AND PLAN:   1.  Her risk of  thrombosis is equal to the general population. She does not have any blood relative with thrombosis. He was a stepbrother who had pulmonary] embolism.  She still would like to like to be to test for risk of thrombosis. I tested for factor V Leiden since is the most common test for patients on birth control and get blood clots. I strongly urged her to stop smoking since that by itself can strongly increase the risk of thrombosis.    2.   we discussed the possibility that if her factor V Leiden is negative, the risk of blood clot on OCP is still not 0. I advised her to discuss with her gynecologist about the possibility of taking progesterone only birth control or IUD.    Erin [name] expressed informed understanding and asked appropriate questions. She was discharged from clinic.  Our service is always available in the future if the need ever arises.        Thank you for this referral.

## 2012-09-28 NOTE — Progress Notes (Signed)
Checked in new patient. No financial issues. °

## 2012-10-02 ENCOUNTER — Encounter: Payer: Self-pay | Admitting: Oncology

## 2012-10-02 ENCOUNTER — Other Ambulatory Visit: Payer: Self-pay | Admitting: Oncology

## 2012-10-29 ENCOUNTER — Other Ambulatory Visit: Payer: Self-pay | Admitting: Physician Assistant

## 2012-10-29 ENCOUNTER — Telehealth: Payer: Self-pay

## 2012-10-29 NOTE — Telephone Encounter (Signed)
Pt is calling because she called her pharmacy to get some medication refilled and they said it was denied. She is wanting some to call her back and explain why it was denied. [facility] back number is [phone]

## 2012-10-30 NOTE — Telephone Encounter (Signed)
Patient advised of need for office visit.

## 2012-10-30 NOTE — Telephone Encounter (Signed)
Because she needs office visit if she is still symptomatic. Left message for her to call me back so I can advise.

## 2012-11-02 ENCOUNTER — Ambulatory Visit (INDEPENDENT_AMBULATORY_CARE_PROVIDER_SITE_OTHER): Payer: 59 | Admitting: Emergency Medicine

## 2012-11-02 VITALS — BP 148/74 | HR 78 | Temp 98.4°F | Resp 16 | Ht 67.0 in | Wt 220.0 lb

## 2012-11-02 DIAGNOSIS — N3000 Acute cystitis without hematuria: Secondary | ICD-10-CM

## 2012-11-02 DIAGNOSIS — R300 Dysuria: Secondary | ICD-10-CM

## 2012-11-02 DIAGNOSIS — R351 Nocturia: Secondary | ICD-10-CM

## 2012-11-02 LAB — POCT URINALYSIS DIPSTICK
Bilirubin, UA: NEGATIVE
Ketones, UA: NEGATIVE
Leukocytes, UA: NEGATIVE
pH, UA: 8.5

## 2012-11-02 LAB — POCT UA - MICROSCOPIC ONLY
Casts, Ur, LPF, POC: NEGATIVE
Mucus, UA: POSITIVE

## 2012-11-02 MED ORDER — CIPROFLOXACIN HCL 500 MG PO TABS: 500.0000 mg | ORAL_TABLET | Freq: Two times a day (BID) | ORAL | Status: DC

## 2012-11-02 MED ORDER — PHENAZOPYRIDINE HCL 200 MG PO TABS: 200.0000 mg | ORAL_TABLET | Freq: Three times a day (TID) | ORAL | Status: DC | PRN

## 2012-11-02 NOTE — Progress Notes (Signed)
[facility] [address] [place] [address] [phone]  Date:  11/02/2012   Name:  Erin [name]   DOB:  26-Sep-1985   MRN:  [id number]  PCP:  Godfrey Pick, PA-C    Chief Complaint: Urinary Tract Infection   History of Present Illness:  Erin [name] is a 27 y.o. very pleasant female patient who presents with the following:  Ill since last weekend with dysuria, urgency and frequency.  Has foul smelling urine.  No fever or chills.  No nausea or vomiting.  No GYN symptoms.  No improvement with over the counter medications or other home remedies. Denies other complaint or health concern today.   Patient Active Problem List  Diagnosis  . ASCUS (atypical squamous cells of undetermined significance) on Pap smear  . Abortion, spontaneous    Past Medical History  Diagnosis Date  . Migraine     Past Surgical History  Procedure Laterality Date  . Dental restoration/extraction with x-ray      History  Substance Use Topics  . Smoking status: Never Smoker   . Smokeless tobacco: Not on file  . Alcohol Use: No    Family History  Problem Relation Age of Onset  . Hypertension Mother     No Known Allergies  Medication list has been reviewed and updated.  Current Outpatient Prescriptions on File Prior to Visit  Medication Sig Dispense Refill  . desogestrel-ethinyl estradiol (APRI,EMOQUETTE,SOLIA) 0.15-30 MG-MCG tablet Take 1 tablet by mouth daily.  1 Package  3   No current facility-administered medications on file prior to visit.    Review of Systems:  As per HPI, otherwise negative.    Physical Examination: Filed Vitals:   11/02/12 1410  BP: 148/74  Pulse: 78  Temp: 98.4 F (36.9 C)  Resp: 16   Filed Vitals:   11/02/12 1410  Height: 5\' 7"  (1.702 m)  Weight: 220 lb (99.791 kg)   Body mass index is 34.45 kg/(m^2). Ideal Body Weight: Weight in (lb) to have BMI = 25: 159.3   GEN: WDWN, NAD, Non-toxic, Alert & Oriented x  3 HEENT: Atraumatic, Normocephalic.  Ears and Nose: No external deformity. EXTR: No clubbing/cyanosis/edema NEURO: Normal gait.  PSYCH: Normally interactive. Conversant. Not depressed or anxious appearing.  Calm demeanor.  ABD:  Soft benign not tender  Assessment and Plan: Acute cystitis cipro Pyridium   Signed,  Phillips Odor, MD   Results for orders placed in visit on 11/02/12  POCT URINALYSIS DIPSTICK      Result Value Range   Color, UA yellow     Clarity, UA clear     Glucose, UA neg     Bilirubin, UA neg     Ketones, UA neg     Spec Grav, UA 1.020     Blood, UA trace     pH, UA 8.5     Protein, UA neg     Urobilinogen, UA 0.2     Nitrite, UA neg     Leukocytes, UA Negative    POCT UA - MICROSCOPIC ONLY      Result Value Range   WBC, Ur, HPF, POC 2-5     RBC, urine, microscopic 4-5     Bacteria, U Microscopic 1+     Mucus, UA pos     Epithelial cells, urine per micros 0-2     Crystals, Ur, HPF, POC neg     Casts, Ur, LPF, POC neg  Yeast, UA neg

## 2012-11-02 NOTE — Patient Instructions (Addendum)
Urinary Tract Infection Urinary tract infections (UTIs) can develop anywhere along your urinary tract. Your urinary tract is your body's drainage system for removing wastes and extra water. Your urinary tract includes two kidneys, two ureters, a bladder, and a urethra. Your kidneys are a pair of bean-shaped organs. Each kidney is about the size of your fist. They are located below your ribs, one on each side of your spine. CAUSES Infections are caused by microbes, which are microscopic organisms, including fungi, viruses, and bacteria. These organisms are so small that they can only be seen through a microscope. Bacteria are the microbes that most commonly cause UTIs. SYMPTOMS  Symptoms of UTIs may vary by age and gender of the patient and by the location of the infection. Symptoms in young women typically include a frequent and intense urge to urinate and a painful, burning feeling in the bladder or urethra during urination. Older women and men are more likely to be tired, shaky, and weak and have muscle aches and abdominal pain. A fever may mean the infection is in your kidneys. Other symptoms of a kidney infection include pain in your back or sides below the ribs, nausea, and vomiting. DIAGNOSIS To diagnose a UTI, your caregiver will ask you about your symptoms. Your caregiver also will ask to provide a urine sample. The urine sample will be tested for bacteria and white blood cells. White blood cells are made by your body to help fight infection. TREATMENT  Typically, UTIs can be treated with medication. Because most UTIs are caused by a bacterial infection, they usually can be treated with the use of antibiotics. The choice of antibiotic and length of treatment depend on your symptoms and the type of bacteria causing your infection. HOME CARE INSTRUCTIONS  If you were prescribed antibiotics, take them exactly as your caregiver instructs you. Finish the medication even if you feel better after you  have only taken some of the medication.  Drink enough water and fluids to keep your urine clear or pale yellow.  Avoid caffeine, tea, and carbonated beverages. They tend to irritate your bladder.  Empty your bladder often. Avoid holding urine for long periods of time.  Empty your bladder before and after sexual intercourse.  After a bowel movement, women should cleanse from front to back. Use each tissue only once. SEEK MEDICAL CARE IF:   You have back pain.  You develop a fever.  Your symptoms do not begin to resolve within 3 days. SEEK IMMEDIATE MEDICAL CARE IF:   You have severe back pain or lower abdominal pain.  You develop chills.  You have nausea or vomiting.  You have continued burning or discomfort with urination. MAKE SURE YOU:   Understand these instructions.  Will watch your condition.  Will get help right away if you are not doing well or get worse. Document Released: 04/06/2005 Document Revised: 12/27/2011 Document Reviewed: 08/05/2011 Exit[employer] Patient Information 2013 [employer].  

## 2012-11-04 NOTE — Progress Notes (Signed)
Reviewed and agree.

## 2013-04-24 IMAGING — CR DG CHEST 2V
2 series · 2 of 2 positions shown · non-contrast
Comparison: None.

CLINICAL DATA: Short of breath and chest pain

CHEST - 2 VIEW

[PA]
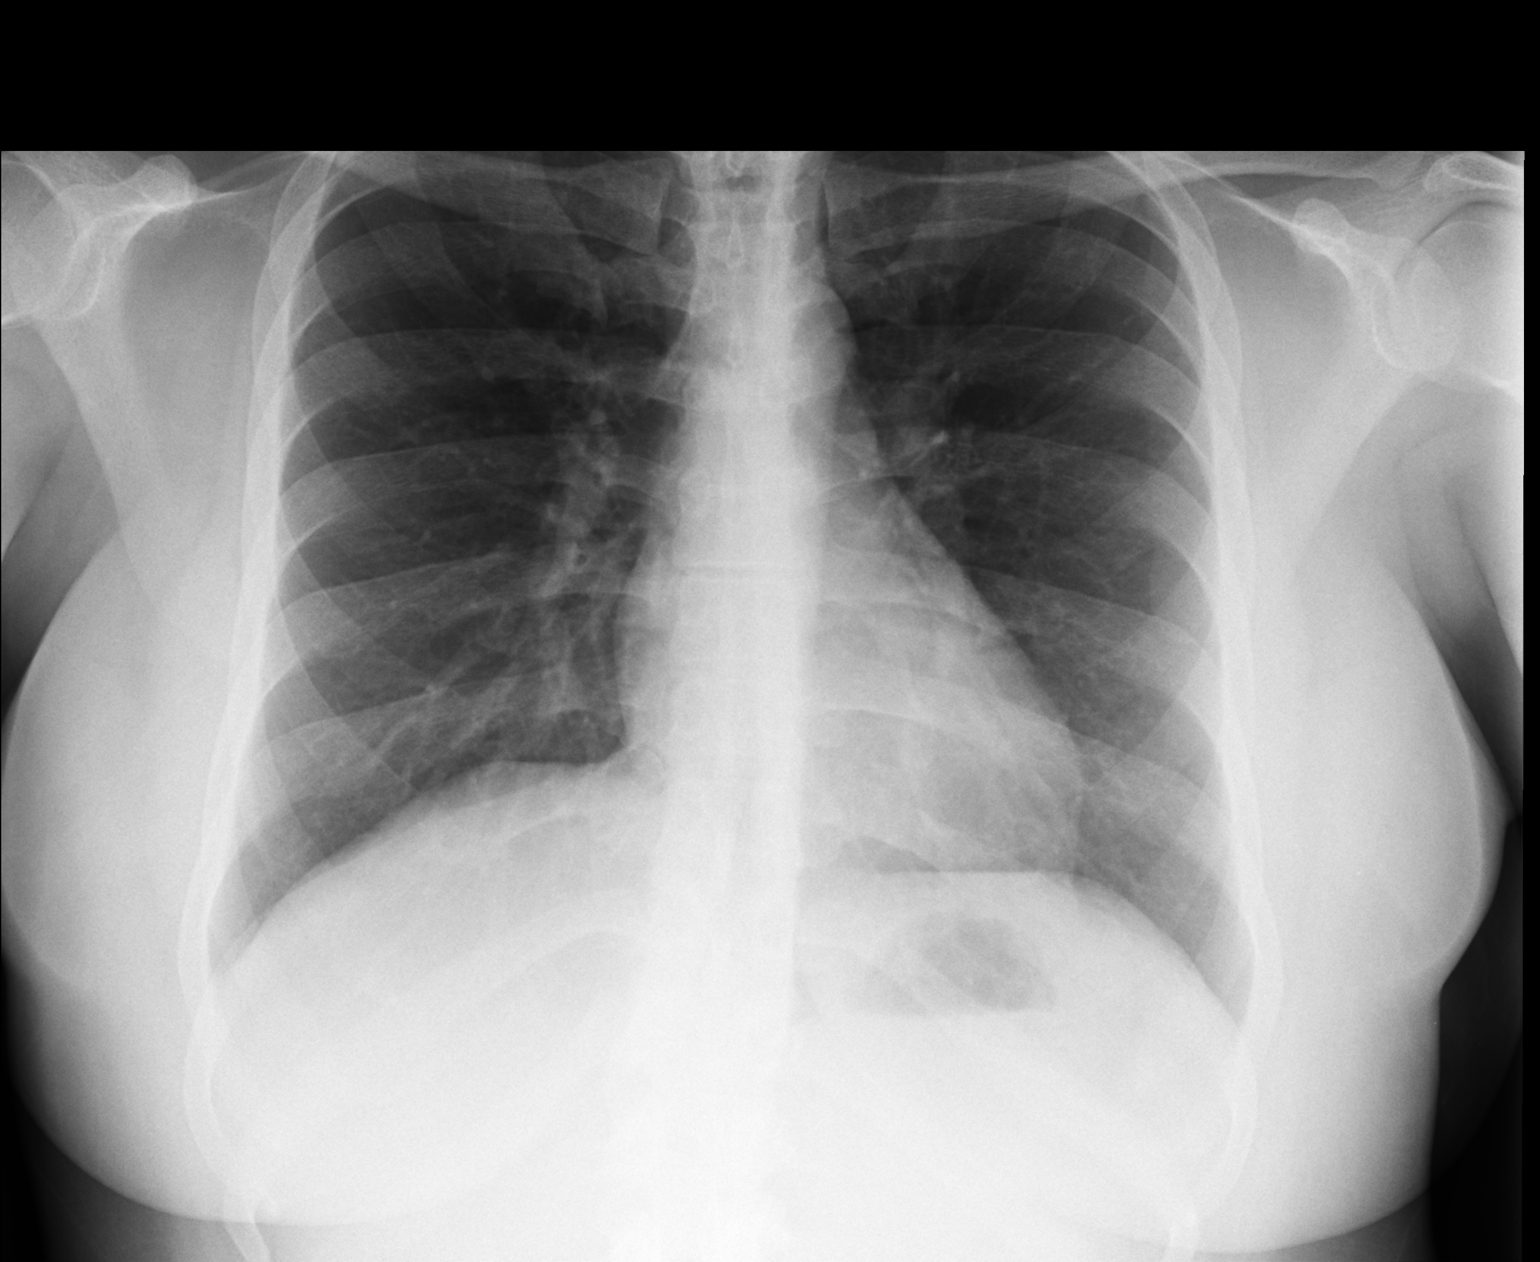

[lateral]
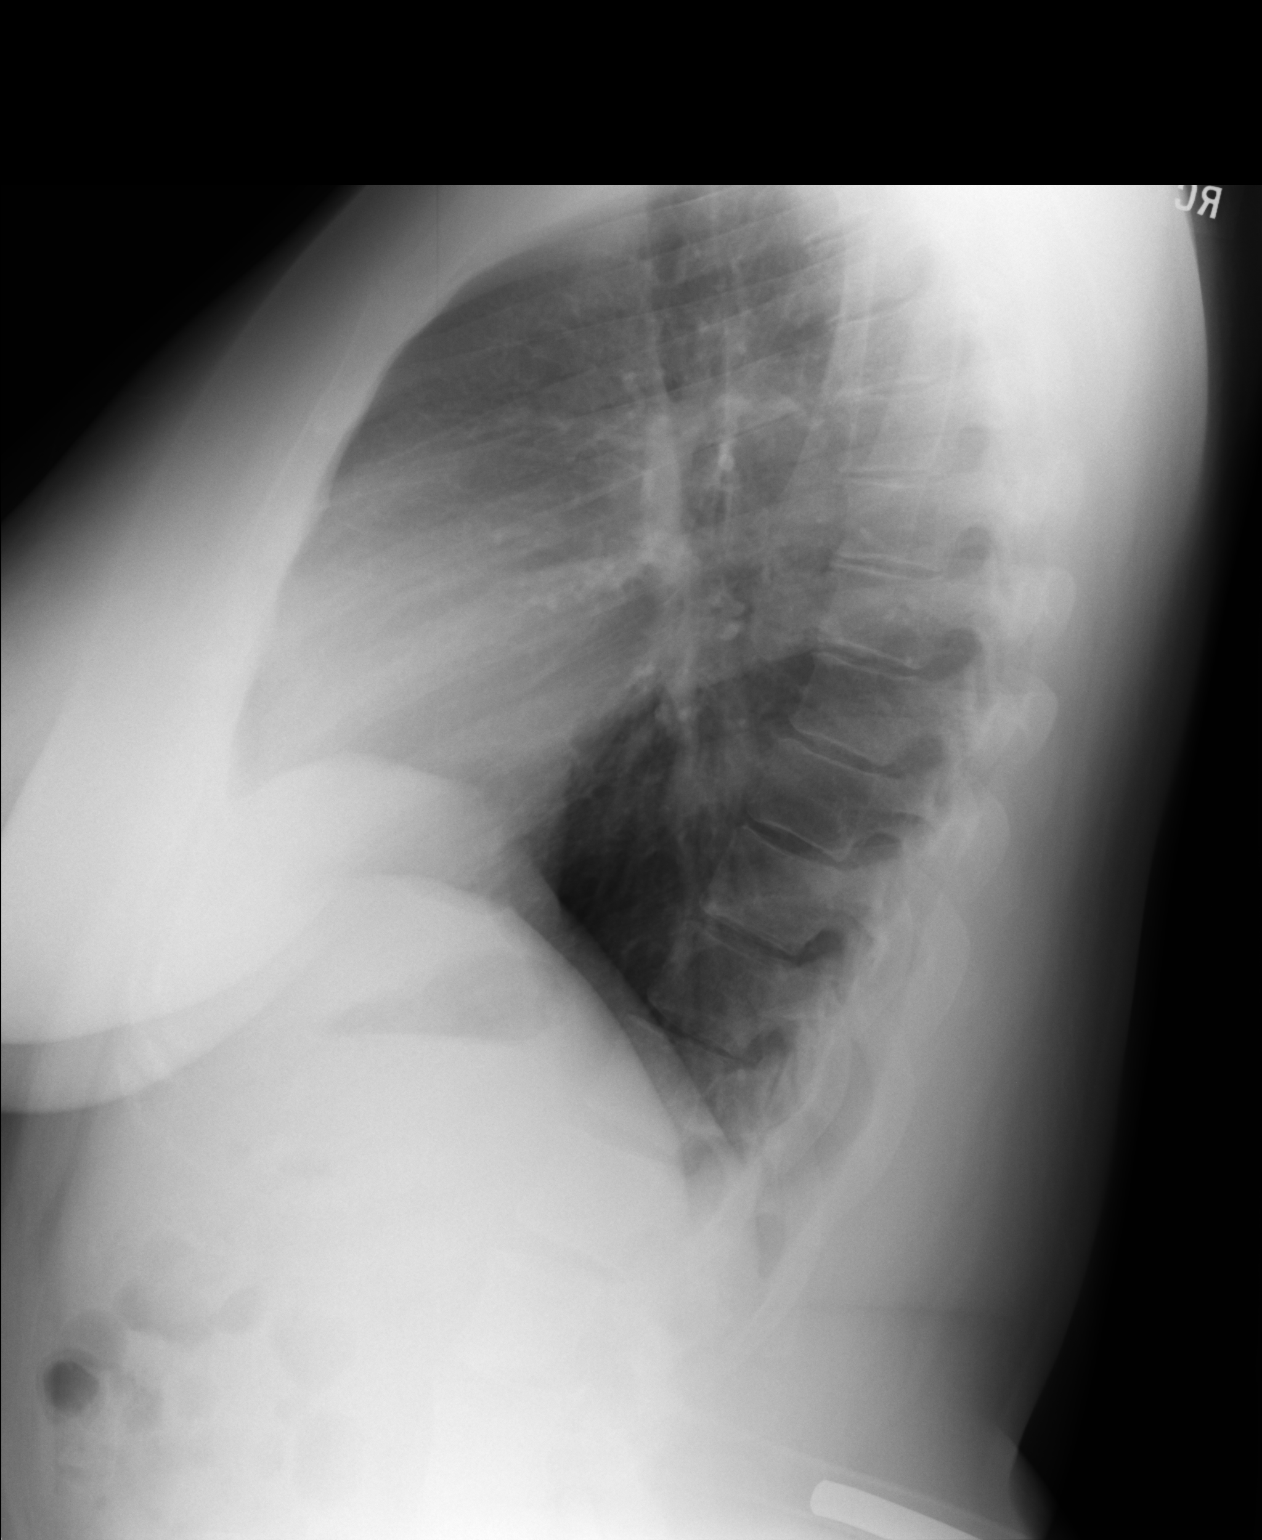

[2 of 2 positions shown; findings below may reference images not displayed]

FINDINGS: Cardiac and mediastinal contours are normal.  Vascularity
is normal.  Lungs are clear without infiltrate or effusion.  No
mass lesion.
IMPRESSION: Negative

Clinically significant discrepancy from primary report, if
provided: None

## 2013-08-29 ENCOUNTER — Ambulatory Visit (INDEPENDENT_AMBULATORY_CARE_PROVIDER_SITE_OTHER): Payer: 59 | Admitting: Emergency Medicine

## 2013-08-29 ENCOUNTER — Ambulatory Visit: Payer: 59

## 2013-08-29 VITALS — BP 122/70 | HR 86 | Temp 99.2°F | Resp 16 | Ht 67.0 in | Wt 204.6 lb

## 2013-08-29 DIAGNOSIS — T189XXA Foreign body of alimentary tract, part unspecified, initial encounter: Secondary | ICD-10-CM

## 2013-08-29 NOTE — Progress Notes (Signed)
[facility] [address], [place] [address] [phone]  Date:  08/29/2013   Name:  Erin [name]   DOB:  Jun 29, 1986   MRN:  [id number]  PCP:  Theda Sers, PA-C    Chief Complaint: something in throat   History of Present Illness:  Erin [name] is a 28 y.o. very pleasant female patient who presents with the following:  Ate ice cream last night.  After eating the ice cream she developed a persistent FB sensation in the base of her neck.  Able to swallow and has eaten today.  No nausea or vomiting.  No stool change.  No improvement with over the counter medications or other home remedies. Denies other complaint or health concern today.   Patient Active Problem List   Diagnosis Date Noted  . ASCUS (atypical squamous cells of undetermined significance) on Pap smear 08/31/2012  . Abortion, spontaneous 08/31/2012    Past Medical History  Diagnosis Date  . Migraine     Past Surgical History  Procedure Laterality Date  . Dental restoration/extraction with x-ray      History  Substance Use Topics  . Smoking status: Never Smoker   . Smokeless tobacco: Not on file  . Alcohol Use: No    Family History  Problem Relation Age of Onset  . Hypertension Mother     No Known Allergies  Medication list has been reviewed and updated.  Current Outpatient Prescriptions on File Prior to Visit  Medication Sig Dispense Refill  . ciprofloxacin (CIPRO) 500 MG tablet Take 1 tablet (500 mg total) by mouth 2 (two) times daily.  10 tablet  0  . desogestrel-ethinyl estradiol (APRI,EMOQUETTE,SOLIA) 0.15-30 MG-MCG tablet Take 1 tablet by mouth daily.  1 Package  3  . phenazopyridine (PYRIDIUM) 200 MG tablet Take 1 tablet (200 mg total) by mouth 3 (three) times daily as needed for pain.  6 tablet  0   No current facility-administered medications on file prior to visit.    Review of Systems:  As per HPI, otherwise negative.    Physical Examination: Filed  Vitals:   08/29/13 1839  BP: 122/70  Pulse: 86  Temp: 99.2 F (37.3 C)  Resp: 16   Filed Vitals:   08/29/13 1839  Height: 5\' 7"  (1.702 m)  Weight: 204 lb 9.6 oz (92.806 kg)   Body mass index is 32.04 kg/(m^2). Ideal Body Weight: Weight in (lb) to have BMI = 25: 159.3   GEN: WDWN, NAD, Non-toxic, Alert & Oriented x 3 HEENT: Atraumatic, Normocephalic.  Ears and Nose: No external deformity. EXTR: No clubbing/cyanosis/edema NEURO: Normal gait.  PSYCH: Normally interactive. Conversant. Not depressed or anxious appearing.  Calm demeanor.    Assessment and Plan: Foreign body sensation throat ENT  Signed,  Ellison Carwin, MD   [facility] reading (PRIMARY) by  Dr. Ouida Sills.  Negative neck.

## 2013-08-29 NOTE — Patient Instructions (Signed)
Swallowed Foreign Body, Adult °You have swallowed an object (foreign body). Once the foreign body has passed through the food tube (esophagus), which leads from the mouth to the stomach, it will usually continue through the body without problems. This is because the point where the esophagus enters into the stomach is the narrowest place through which the foreign body must pass. °Sometimes the foreign body gets stuck. The most common type of foreign body obstruction in adults is food impaction. Many times, bones from fish or meat products may become lodged in the esophagus or injure the throat on the way down. When there is an object that obstructs the esophagus, the most obvious symptoms are pain and the inability to swallow normally. In some cases, foreign bodies that can be life threatening are swallowed. Examples of these are certain medications and illicit drugs. Often in these instances, patients are afraid of telling what they swallowed. However, it is extremely important to tell the emergency caregiver what was swallowed because life-saving treatment may be needed.  °X-ray exams may be taken to find the location of the foreign body. However, some objects do not show up well or may be too small to be seen on an X-ray image. If the foreign body is too large or too sharp, it may be too dangerous to allow it to pass on its own. You may need to see a caregiver who specializes in the digestive system (gastroenterologist). In a few cases, a specialist may need to remove the object using a method called "endoscopy".  This involves passing a thin, soft, flexible tube into the food pipe to locate and remove the object. Follow up with your primary doctor or the referral you were given by the emergency caregiver. °HOME CARE INSTRUCTIONS  °· If your caregiver says it is safe for you to eat, then only have liquids and soft foods until your symptoms improve. °· Once you are eating normally: °· Cut food into small  pieces. °· Remove small bones from food. °· Remove large seeds and pits from fruit. °· Chew your food well. °· Do not talk, laugh, or engage in physical activity while eating or swallowing. °SEEK MEDICAL CARE IF: °· You develop worsening shortness of breath, uncontrollable coughing, chest pains or high fever, greater than 102° F (38.9° C). °· You are unable to eat or drink or you feel that food is getting stuck in your throat. °· You have choking symptoms or cannot stop drooling. °· You develop abdominal pain, vomiting (especially of blood), or rectal bleeding. °MAKE SURE YOU:  °· Understand these instructions. °· Will watch your condition. °· Will get help right away if you are not doing well or get worse. °Document Released: 12/15/2009 Document Revised: 09/19/2011 Document Reviewed: 12/15/2009 °ExitCare® Patient Information ©2014 [employer]. ° °

## 2013-09-02 ENCOUNTER — Telehealth: Payer: Self-pay

## 2013-09-02 NOTE — Telephone Encounter (Signed)
Pt. Is returning a call regarding her x-ray. Please call [phone]

## 2013-09-04 NOTE — Telephone Encounter (Signed)
Please review

## 2013-09-04 NOTE — Telephone Encounter (Signed)
It was negative  If she is still symptomatic refer her to ENT.  Thanks

## 2013-09-08 NOTE — Telephone Encounter (Signed)
lmom to cb. 

## 2013-09-09 NOTE — Telephone Encounter (Signed)
lmom to cb. 

## 2013-09-10 NOTE — Telephone Encounter (Signed)
Left message to return call 

## 2013-09-11 NOTE — Telephone Encounter (Signed)
Letter sent.

## 2013-10-04 ENCOUNTER — Other Ambulatory Visit (HOSPITAL_COMMUNITY)
Admission: RE | Admit: 2013-10-04 | Discharge: 2013-10-04 | Disposition: A | Discharge status: Home / Self Care | Payer: 59 | Source: Ambulatory Visit | Attending: Gynecology | Admitting: Gynecology

## 2013-10-04 ENCOUNTER — Ambulatory Visit (INDEPENDENT_AMBULATORY_CARE_PROVIDER_SITE_OTHER): Payer: 59 | Admitting: Gynecology

## 2013-10-04 ENCOUNTER — Encounter: Payer: Self-pay | Admitting: Gynecology

## 2013-10-04 VITALS — BP 134/84 | Ht 68.0 in | Wt 204.0 lb

## 2013-10-04 DIAGNOSIS — Z01419 Encounter for gynecological examination (general) (routine) without abnormal findings: Secondary | ICD-10-CM

## 2013-10-04 DIAGNOSIS — N920 Excessive and frequent menstruation with regular cycle: Secondary | ICD-10-CM

## 2013-10-04 DIAGNOSIS — Z309 Encounter for contraceptive management, unspecified: Secondary | ICD-10-CM

## 2013-10-04 DIAGNOSIS — Z1151 Encounter for screening for human papillomavirus (HPV): Secondary | ICD-10-CM | POA: Insufficient documentation

## 2013-10-04 DIAGNOSIS — Z113 Encounter for screening for infections with a predominantly sexual mode of transmission: Secondary | ICD-10-CM

## 2013-10-04 LAB — CBC WITH DIFFERENTIAL/PLATELET
BASOS ABS: 0 10*3/uL (ref 0.0–0.1)
Basophils Relative: 0 % (ref 0–1)
Eosinophils Absolute: 0.1 10*3/uL (ref 0.0–0.7)
Eosinophils Relative: 2 % (ref 0–5)
HEMATOCRIT: 36.4 % (ref 36.0–46.0)
Hemoglobin: 12.3 g/dL (ref 12.0–15.0)
LYMPHS ABS: 1.6 10*3/uL (ref 0.7–4.0)
LYMPHS PCT: 37 % (ref 12–46)
MCH: 29.7 pg (ref 26.0–34.0)
MCHC: 33.8 g/dL (ref 30.0–36.0)
MCV: 87.9 fL (ref 78.0–100.0)
Monocytes Absolute: 0.4 10*3/uL (ref 0.1–1.0)
Monocytes Relative: 9 % (ref 3–12)
NEUTROS ABS: 2.2 10*3/uL (ref 1.7–7.7)
Neutrophils Relative %: 52 % (ref 43–77)
PLATELETS: 321 10*3/uL (ref 150–400)
RBC: 4.14 MIL/uL (ref 3.87–5.11)
RDW: 13.1 % (ref 11.5–15.5)
WBC: 4.2 10*3/uL (ref 4.0–10.5)

## 2013-10-04 LAB — LIPID PANEL
Cholesterol: 139 mg/dL (ref 0–200)
HDL: 41 mg/dL (ref >39)
LDL CALC: 84 mg/dL (ref 0–99)
Total CHOL/HDL Ratio: 3.4 Ratio
Triglycerides: 70 mg/dL (ref <150)
VLDL: 14 mg/dL (ref 0–40)

## 2013-10-04 LAB — GLUCOSE, RANDOM: Glucose, Bld: 83 mg/dL (ref 70–99)

## 2013-10-04 NOTE — Patient Instructions (Addendum)
Follow up for ultrasound as scheduled.  You may obtain a copy of any labs that were done today by logging onto MyChart as outlined in the instructions provided with your AVS (after visit summary). The office will not call with normal lab results but certainly if there are any significant abnormalities then we will contact you.   Health Maintenance, Female A healthy lifestyle and preventative care can promote health and wellness.  Maintain regular health, dental, and eye exams.  Eat a healthy diet. Foods like vegetables, fruits, whole grains, low-fat dairy products, and lean protein foods contain the nutrients you need without too many calories. Decrease your intake of foods high in solid fats, added sugars, and salt. Get information about a proper diet from your caregiver, if necessary.  Regular physical exercise is one of the most important things you can do for your health. Most adults should get at least 150 minutes of moderate-intensity exercise (any activity that increases your heart rate and causes you to sweat) each week. In addition, most adults need muscle-strengthening exercises on 2 or more days a week.   Maintain a healthy weight. The body mass index (BMI) is a screening tool to identify possible weight problems. It provides an estimate of body fat based on height and weight. Your caregiver can help determine your BMI, and can help you achieve or maintain a healthy weight. For adults 20 years and older:  A BMI below 18.5 is considered underweight.  A BMI of 18.5 to 24.9 is normal.  A BMI of 25 to 29.9 is considered overweight.  A BMI of 30 and above is considered obese.  Maintain normal blood lipids and cholesterol by exercising and minimizing your intake of saturated fat. Eat a balanced diet with plenty of fruits and vegetables. Blood tests for lipids and cholesterol should begin at age 20 and be repeated every 5 years. If your lipid or cholesterol levels are high, you are over  50, or you are a high risk for heart disease, you may need your cholesterol levels checked more frequently.Ongoing high lipid and cholesterol levels should be treated with medicines if diet and exercise are not effective.  If you smoke, find out from your caregiver how to quit. If you do not use tobacco, do not start.  Lung cancer screening is recommended for adults aged 55 80 years who are at high risk for developing lung cancer because of a history of smoking. Yearly low-dose computed tomography (CT) is recommended for people who have at least a 30-pack-year history of smoking and are a current smoker or have quit within the past 15 years. A pack year of smoking is smoking an average of 1 pack of cigarettes a day for 1 year (for example: 1 pack a day for 30 years or 2 packs a day for 15 years). Yearly screening should continue until the smoker has stopped smoking for at least 15 years. Yearly screening should also be stopped for people who develop a health problem that would prevent them from having lung cancer treatment.  If you are pregnant, do not drink alcohol. If you are breastfeeding, be very cautious about drinking alcohol. If you are not pregnant and choose to drink alcohol, do not exceed 1 drink per day. One drink is considered to be 12 ounces (355 mL) of beer, 5 ounces (148 mL) of wine, or 1.5 ounces (44 mL) of liquor.  Avoid use of street drugs. Do not share needles with anyone. Ask for help if   need support or instructions about stopping the use of drugs.  High blood pressure causes heart disease and increases the risk of stroke. Blood pressure should be checked at least every 1 to 2 years. Ongoing high blood pressure should be treated with medicines, if weight loss and exercise are not effective.  If you are 56 to 26 years old, ask your caregiver if you should take aspirin to prevent strokes.  Diabetes screening involves taking a blood sample to check your fasting blood sugar level.  This should be done once every 3 years, after age 70, if you are within normal weight and without risk factors for diabetes. Testing should be considered at a younger age or be carried out more frequently if you are overweight and have at least 1 risk factor for diabetes.  Breast cancer screening is essential preventative care for women. You should practice "breast self-awareness." This means understanding the normal appearance and feel of your breasts and may include breast self-examination. Any changes detected, no matter how small, should be reported to a caregiver. Women in their 40s and 30s should have a clinical breast exam (CBE) by a caregiver as part of a regular health exam every 1 to 3 years. After age 69, women should have a CBE every year. Starting at age 28, women should consider having a mammogram (breast X-ray) every year. Women who have a family history of breast cancer should talk to their caregiver about genetic screening. Women at a high risk of breast cancer should talk to their caregiver about having an MRI and a mammogram every year.  Breast cancer gene (BRCA)-related cancer risk assessment is recommended for women who have family members with BRCA-related cancers. BRCA-related cancers include breast, ovarian, tubal, and peritoneal cancers. Having family members with these cancers may be associated with an increased risk for harmful changes (mutations) in the breast cancer genes BRCA1 and BRCA2. Results of the assessment will determine the need for genetic counseling and BRCA1 and BRCA2 testing.  The Pap test is a screening test for cervical cancer. Women should have a Pap test starting at age 47. Between ages 57 and 34, Pap tests should be repeated every 2 years. Beginning at age 72, you should have a Pap test every 3 years as long as the past 3 Pap tests have been normal. If you had a hysterectomy for a problem that was not cancer or a condition that could lead to cancer, then you no  longer need Pap tests. If you are between ages 74 and 63, and you have had normal Pap tests going back 10 years, you no longer need Pap tests. If you have had past treatment for cervical cancer or a condition that could lead to cancer, you need Pap tests and screening for cancer for at least 20 years after your treatment. If Pap tests have been discontinued, risk factors (such as a new sexual partner) need to be reassessed to determine if screening should be resumed. Some women have medical problems that increase the chance of getting cervical cancer. In these cases, your caregiver may recommend more frequent screening and Pap tests.  The human papillomavirus (HPV) test is an additional test that may be used for cervical cancer screening. The HPV test looks for the virus that can cause the cell changes on the cervix. The cells collected during the Pap test can be tested for HPV. The HPV test could be used to screen women aged 66 years and older, and should be  be used in women of any age who have unclear Pap test results. After the age of 30, women should have HPV testing at the same frequency as a Pap test.  Colorectal cancer can be detected and often prevented. Most routine colorectal cancer screening begins at the age of 50 and continues through age 75. However, your caregiver may recommend screening at an earlier age if you have risk factors for colon cancer. On a yearly basis, your caregiver may provide home test kits to check for hidden blood in the stool. Use of a small camera at the end of a tube, to directly examine the colon (sigmoidoscopy or colonoscopy), can detect the earliest forms of colorectal cancer. Talk to your caregiver about this at age 50, when routine screening begins. Direct examination of the colon should be repeated every 5 to 10 years through age 75, unless early forms of pre-cancerous polyps or small growths are found.  Hepatitis C blood testing is recommended for all people born from  1945 through 1965 and any individual with known risks for hepatitis C.  Practice safe sex. Use condoms and avoid high-risk sexual practices to reduce the spread of sexually transmitted infections (STIs). Sexually active women aged 25 and younger should be checked for Chlamydia, which is a common sexually transmitted infection. Older women with new or multiple partners should also be tested for Chlamydia. Testing for other STIs is recommended if you are sexually active and at increased risk.  Osteoporosis is a disease in which the bones lose minerals and strength with aging. This can result in serious bone fractures. The risk of osteoporosis can be identified using a bone density scan. Women ages 65 and over and women at risk for fractures or osteoporosis should discuss screening with their caregivers. Ask your caregiver whether you should be taking a calcium supplement or vitamin D to reduce the rate of osteoporosis.  Menopause can be associated with physical symptoms and risks. Hormone replacement therapy is available to decrease symptoms and risks. You should talk to your caregiver about whether hormone replacement therapy is right for you.  Use sunscreen. Apply sunscreen liberally and repeatedly throughout the day. You should seek shade when your shadow is shorter than you. Protect yourself by wearing long sleeves, pants, a wide-brimmed hat, and sunglasses year round, whenever you are outdoors.  Notify your caregiver of new moles or changes in moles, especially if there is a change in shape or color. Also notify your caregiver if a mole is larger than the size of a pencil eraser.  Stay current with your immunizations. Document Released: 01/10/2011 Document Revised: 10/22/2012 Document Reviewed: 01/10/2011 ExitCare Patient Information 2014 [employer].   

## 2013-10-04 NOTE — Progress Notes (Signed)
Erin [name] [birth year] [id number]        27 y.o.  G1P0010 for annual exam.  Has not been seen in the office for several years. Several issues noted below.  Past medical history,surgical history, problem list, medications, allergies, family history and social history were all reviewed and documented in the EPIC chart.  ROS:  Performed and pertinent positives and negatives are included in the history, assessment and plan .  Exam: Kim assistant Filed Vitals:   10/04/13 1129  BP: 134/84  Height: 5\' 8"  (1.727 m)  Weight: 204 lb (92.534 kg)   General appearance  Normal Skin grossly normal Head/Neck normal with no cervical or supraclavicular adenopathy thyroid normal Lungs  clear Cardiac RR, without RMG Abdominal  soft, nontender, without masses, organomegaly or hernia Breasts  examined lying and sitting without masses, retractions, discharge or axillary adenopathy. Pelvic  Ext/BUS/vagina normal.  Cervix normal. Pap/HPV, GC/Chlamydia  Uterus anteverted, normal size, shape and contour, midline and mobile nontender   Adnexa  Without masses or tenderness    Anus and perineum  Normal     Assessment/Plan:  28 y.o. G35P0010 female for annual exam with regular heavy menses, condom contraception .   1. Menorrhagia. Patient does have the past year her menses have progressively gotten heavier. Lasting 5 days with frequent pad changes. No bleeding in between periods no significant dysmenorrhea. No other symptoms such as weight gain weight loss hair or skin changes. Will start with CBC, TSH and sonohysterogram to rule out intracavitary abnormalities. 2. Contraceptive management. Using condoms. I reviewed the failure risk with this and the availability of plan B. Alternative options to include pill, patch, ring, Depo-Provera, nexplanon, IUD were all reviewed. The pros/cons, risks/benefits of each choice discussed. We'll further discuss after the above testing. 3. Pap smear 2014 was negative. 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 Colposcopy showed small area of acetowhite change. Biopsy was negative. The treatment rendered at that time. Pap/HPV done today. 4. STD screening. Patient requests STD screening. No known exposures but just wants to be screened. GC/chlamydia, HIV, RPR, hepatitis B, hepatitis C done. 5. Breast health. SBE monthly reviewed. 6. Gardasil series received. 7. Borderline blood pressure 134/84. Reviewed with patient and asked her to have recheck in a non-exam situation. If would remain elevated then will need to followup with primary physician. 8. Health maintenance. Baseline CBC glucose lipid profile urinalysis TSH and STD blood work ordered as above. Followup for sonohysterogram as scheduled.   Note: This document was prepared with digital dictation and possible smart phrase technology. Any transcriptional errors that result from this process are unintentional.   Anastasio Auerbach MD, 12:08 PM 10/04/2013

## 2013-10-05 LAB — URINALYSIS W MICROSCOPIC + REFLEX CULTURE
Bacteria, UA: NONE SEEN
Bilirubin Urine: NEGATIVE
CASTS: NONE SEEN
CRYSTALS: NONE SEEN
Glucose, UA: NEGATIVE mg/dL
Hgb urine dipstick: NEGATIVE
Ketones, ur: NEGATIVE mg/dL
LEUKOCYTES UA: NEGATIVE
Nitrite: NEGATIVE
PH: 5.5 (ref 5.0–8.0)
Protein, ur: NEGATIVE mg/dL
Specific Gravity, Urine: 1.025 (ref 1.005–1.030)
Squamous Epithelial / LPF: NONE SEEN
UROBILINOGEN UA: 0.2 mg/dL (ref 0.0–1.0)

## 2013-10-05 LAB — TSH: TSH: 2.401 u[IU]/mL (ref 0.350–4.500)

## 2013-10-05 LAB — HIV ANTIBODY (ROUTINE TESTING W REFLEX): HIV: NONREACTIVE

## 2013-10-05 LAB — HEPATITIS C ANTIBODY: HCV AB: NEGATIVE

## 2013-10-05 LAB — HEPATITIS B SURFACE ANTIGEN: Hepatitis B Surface Ag: NEGATIVE

## 2013-10-05 LAB — RPR

## 2013-10-07 LAB — GC/CHLAMYDIA PROBE AMP
CT Probe RNA: NEGATIVE
GC Probe RNA: NEGATIVE

## 2014-01-23 ENCOUNTER — Ambulatory Visit: Payer: 59

## 2014-02-14 ENCOUNTER — Ambulatory Visit (INDEPENDENT_AMBULATORY_CARE_PROVIDER_SITE_OTHER): Payer: 59 | Admitting: Family Medicine

## 2014-02-14 VITALS — BP 138/80 | HR 89 | Temp 98.3°F | Resp 16 | Ht 67.0 in | Wt 193.2 lb

## 2014-02-14 DIAGNOSIS — R05 Cough: Secondary | ICD-10-CM

## 2014-02-14 DIAGNOSIS — Z23 Encounter for immunization: Secondary | ICD-10-CM

## 2014-02-14 DIAGNOSIS — R059 Cough, unspecified: Secondary | ICD-10-CM

## 2014-02-14 DIAGNOSIS — N644 Mastodynia: Secondary | ICD-10-CM

## 2014-02-14 MED ORDER — ALBUTEROL SULFATE HFA 108 (90 BASE) MCG/ACT IN AERS: 2.0000 | INHALATION_SPRAY | RESPIRATORY_TRACT | Status: DC | PRN

## 2014-02-14 NOTE — Progress Notes (Signed)
   Subjective:    Patient ID: Erin [name], female    DOB: [birth year], 28 y.o.   MRN: [id number]  HPI This is a very pleasant 28 yo female who is accompanied by her mother. Patient presents today with pain in her breasts. This has been getting progressively worse for seveal weeks to the point that it was unbearable last night. The left breast is more tender than the right. She has noticed for awhile that her breasts are tender when she removes her bra in the evening.She has worn 2 bras for many years to support and minimize her large breasts. She denies premenstrual breast tenderness. She is very concerned about having breast cancer. She works as a Haematologist. She denies any unusual activity with her arms or pushing/pulling/lifting.  Patient reports a seasonal, nonproductive cough every summer. She does not recall an asthma diagnosis as a child, but did have an inhaler when she was about 28 yo.   Her LMP 01/28/14. She is not sexually active.   No family history of breast cancer  Review of Systems No discharge from nipples, no fever, no pain under arms. No SOB, no wheeze.    Objective:   Physical Exam  Vitals reviewed. Constitutional: She is oriented to person, place, and time. She appears well-developed and well-nourished.  HENT:  Head: Normocephalic and atraumatic.  Right Ear: External ear normal.  Left Ear: External ear normal.  Eyes: Conjunctivae are normal. Right eye exhibits no discharge.  Neck: Normal range of motion. Neck supple.  Cardiovascular: Normal rate and regular rhythm.   Pulmonary/Chest: Effort normal and breath sounds normal. Right breast exhibits no inverted nipple, no mass, no nipple discharge, no skin change and no tenderness. Left breast exhibits no inverted nipple, no mass, no nipple discharge, no skin change and no tenderness. Breast asymmetry: left breast slightly larger than right.  Musculoskeletal: Normal range of motion.  Neurological: She is alert and oriented  to person, place, and time.  Skin: Skin is warm and dry.  Psychiatric: She has a normal mood and affect. Her behavior is normal. Judgment and thought content normal.   PF best 350, This is 84% predicted      Assessment & Plan:  1. Cough - albuterol (PROVENTIL HFA;VENTOLIN HFA) 108 (90 BASE) MCG/ACT inhaler; Inhale 2 puffs into the lungs every 4 (four) hours as needed for wheezing or shortness of breath (cough, shortness of breath or wheezing.).  Dispense: 1 Inhaler; Refill: 1 -encouraged patient to get fitted for new bra and to find a single bra to offer support and minimization but that will allow her to expand ribcage for breathing -Follow up in 1 month for repeat peak flow  2. Need for Tdap vaccination - Tdap vaccine greater than or equal to 7yo IM  3. Pain of both breasts -although I do not palpate any abnormality of her breasts, she is very anxious and concerned that she has cancer - US Breast Bilateral; Future   Elby Beck, FNP-BC  [facility]  02/14/2014 4:16 PM

## 2014-02-14 NOTE — Patient Instructions (Signed)
Use inhaler for cough/chest tightness Try an over the counter antihistamine- zyrtec or claritin (generic fine) Follow up in 1 month for breathing Get fitted for new bras

## 2014-02-17 ENCOUNTER — Other Ambulatory Visit: Payer: Self-pay | Admitting: Family Medicine

## 2014-02-17 DIAGNOSIS — N644 Mastodynia: Secondary | ICD-10-CM

## 2014-02-18 ENCOUNTER — Telehealth: Payer: Self-pay

## 2014-02-18 NOTE — Telephone Encounter (Signed)
Patient called and states her R arm is sore and feels "heavy" since she had her tetanus shot. Please return call and advise. CB # [phone]

## 2014-02-18 NOTE — Telephone Encounter (Signed)
Spoke to pt , explained this is a normal feeling, everyone is different, make sure she is using it  Dont baby her arm. She expressed an understanding

## 2014-03-04 ENCOUNTER — Other Ambulatory Visit: Payer: 59

## 2014-03-05 ENCOUNTER — Ambulatory Visit
Admission: RE | Admit: 2014-03-05 | Discharge: 2014-03-05 | Disposition: A | Discharge status: Home / Self Care | Payer: No Typology Code available for payment source | Source: Ambulatory Visit | Attending: Family Medicine | Admitting: Family Medicine

## 2014-03-05 DIAGNOSIS — N644 Mastodynia: Secondary | ICD-10-CM

## 2014-05-13 ENCOUNTER — Encounter: Payer: Self-pay | Admitting: Family Medicine

## 2014-05-13 ENCOUNTER — Ambulatory Visit: Payer: Self-pay | Attending: Family Medicine | Admitting: Family Medicine

## 2014-05-13 VITALS — BP 150/95 | HR 65 | Temp 98.8°F | Resp 18 | Ht 68.0 in | Wt 198.0 lb

## 2014-05-13 DIAGNOSIS — K029 Dental caries, unspecified: Secondary | ICD-10-CM | POA: Insufficient documentation

## 2014-05-13 DIAGNOSIS — K011 Impacted teeth: Secondary | ICD-10-CM | POA: Insufficient documentation

## 2014-05-13 NOTE — Patient Instructions (Signed)
Erin [name],  Thank you for coming in today. It was a pleasure meeting you. I look forward to being your primary doctor.  For dental pain: I have placed a referral the orange card provides a discount for dental the [place] discount does not. Please pick up the orange card application at the front desk. Fill out the application and turn it in. If you qualify for the orange card the referral will be processed using the orange card.   F/u as needed   Dr. Adrian Blackwater

## 2014-05-13 NOTE — Progress Notes (Signed)
Establish Care Pt complaining of wisdom tooth ache Stated about a month ago

## 2014-05-13 NOTE — Progress Notes (Signed)
   Subjective:    Patient ID: Erin [name], female    DOB: 02/16/1986, 28 y.o.   MRN: [id number] CC: dental pain  HPI 28 yo F with dental pain x 1 month. No fever or jaw swelling. Increased sensitivity. Uninsured   Soc hx: non smoker Srg hx: positve for previous molar extraction R lower  Med hx: negative for diabetes  Review of Systems As pr HPI     Objective:   Physical Exam BP 150/95 mmHg  Pulse 65  Temp(Src) 98.8 F (37.1 C) (Oral)  Resp 18  Ht 5\' 8"  (1.727 m)  Wt 198 lb (89.812 kg)  BMI 30.11 kg/m2  SpO2 97%  LMP 04/19/2014 General appearance: alert, cooperative and no distress Throat: molar crowding and pushing out laterally b/l upper L lower molar cavity.  No jaw swelling. No erythema.       Assessment & Plan:

## 2014-05-13 NOTE — Assessment & Plan Note (Signed)
For dental pain: I have placed a referral the orange card provides a discount for dental the [place] discount does not. Please pick up the orange card application at the front desk. Fill out the application and turn it in. If you qualify for the orange card the referral will be processed using the orange card.

## 2014-05-13 NOTE — Assessment & Plan Note (Signed)
For dental pain: I have placed a referral the orange card provides a discount for dental the [place] discount does not. Please pick up the orange card application at the front desk. Fill out the application and turn it in. If you qualify for the orange card the referral will be processed using the orange card.

## 2014-08-13 ENCOUNTER — Other Ambulatory Visit: Payer: Self-pay | Admitting: Family Medicine

## 2014-08-13 DIAGNOSIS — N644 Mastodynia: Secondary | ICD-10-CM

## 2014-08-15 ENCOUNTER — Emergency Department (HOSPITAL_COMMUNITY)
Admission: EM | Admit: 2014-08-15 | Discharge: 2014-08-15 | Disposition: A | Discharge status: Home / Self Care | Payer: Self-pay | Attending: Emergency Medicine | Admitting: Emergency Medicine

## 2014-08-15 ENCOUNTER — Encounter (HOSPITAL_COMMUNITY): Payer: Self-pay | Admitting: Emergency Medicine

## 2014-08-15 DIAGNOSIS — Y9389 Activity, other specified: Secondary | ICD-10-CM | POA: Insufficient documentation

## 2014-08-15 DIAGNOSIS — X58XXXA Exposure to other specified factors, initial encounter: Secondary | ICD-10-CM | POA: Insufficient documentation

## 2014-08-15 DIAGNOSIS — Z8679 Personal history of other diseases of the circulatory system: Secondary | ICD-10-CM | POA: Insufficient documentation

## 2014-08-15 DIAGNOSIS — S025XXA Fracture of tooth (traumatic), initial encounter for closed fracture: Secondary | ICD-10-CM | POA: Insufficient documentation

## 2014-08-15 DIAGNOSIS — K0889 Other specified disorders of teeth and supporting structures: Secondary | ICD-10-CM

## 2014-08-15 DIAGNOSIS — Y998 Other external cause status: Secondary | ICD-10-CM | POA: Insufficient documentation

## 2014-08-15 DIAGNOSIS — Y9289 Other specified places as the place of occurrence of the external cause: Secondary | ICD-10-CM | POA: Insufficient documentation

## 2014-08-15 HISTORY — DX: Unspecified asthma, uncomplicated: J45.909

## 2014-08-15 MED ORDER — HYDROCODONE-ACETAMINOPHEN 5-325 MG PO TABS: 1.0000 | ORAL_TABLET | Freq: Four times a day (QID) | ORAL | Status: DC | PRN

## 2014-08-15 MED ORDER — HYDROCODONE-ACETAMINOPHEN 5-325 MG PO TABS
2.0000 | ORAL_TABLET | Freq: Once | ORAL | Status: AC
  Administered 2014-08-15: 2 via ORAL
  Filled 2014-08-15: qty 2

## 2014-08-15 MED ORDER — PENICILLIN V POTASSIUM 500 MG PO TABS: 500.0000 mg | ORAL_TABLET | Freq: Four times a day (QID) | ORAL | Status: DC

## 2014-08-15 NOTE — ED Notes (Addendum)
Pt was eating granola and chipped tooth on hard piece in cereal. L upper back molar chipped on lateral side. Pt accidentally swallowed tooth fragment

## 2014-08-15 NOTE — Discharge Instructions (Signed)

## 2014-08-15 NOTE — ED Provider Notes (Signed)
CSN: [id number]     Arrival date & time 08/15/14  1629 History  This chart was scribed for non-physician practitioner Lorre Munroe, PA-C working with Richarda Blade, MD by Hilda Lias, ED Scribe. This patient was seen in room WTR8/WTR8 and the patient's care was started at 5:12 PM.    Chief Complaint  Patient presents with  . Dental Injury   The history is provided by the patient. No language interpreter was used.     HPI Comments: Erin [name] is a 29 y.o. female who presents to the Emergency Department complaining of dental pain in a left upper molar that has been present since one hours PTA. Pt states that she noticed that she chipped her tooth after she finished eating granola cereal. Pt states that she may have swallowed her tooth fragment, and notes that she cannot recall the exact moment when her tooth chipped.   Has not taken anything for her pain. States pain is moderate severe.    Past Medical History  Diagnosis Date  . Migraine   . 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 Dental restoration/extraction with x-ray    . Colposcopy  2011    negative Bx   Family History  Problem Relation Age of Onset  . Hypertension Mother    History  Substance Use Topics  . Smoking status: Never Smoker   . Smokeless tobacco: Not on file  . Alcohol Use: Yes     Comment: Rare   OB History    Gravida Para Term Preterm AB TAB SAB Ectopic Multiple Living   1    1  1         Review of Systems  Constitutional: Negative for fever and chills.  HENT: Positive for dental problem. Negative for drooling.   Neurological: Negative for speech difficulty.  Psychiatric/Behavioral: Positive for sleep disturbance.      Allergies  Review of patient's allergies indicates no known allergies.  Home Medications   Prior to Admission medications   Medication Sig Start Date End Date Taking? Authorizing Provider  albuterol (PROVENTIL  HFA;VENTOLIN HFA) 108 (90 BASE) MCG/ACT inhaler Inhale 2 puffs into the lungs every 4 (four) hours as needed for wheezing or shortness of breath (cough, shortness of breath or wheezing.). 02/14/14   Elby Beck, FNP   There were no vitals taken for this visit. Physical Exam  Constitutional: She is oriented to person, place, and time. She appears well-developed and well-nourished.  HENT:  Head: Normocephalic and atraumatic.  Mouth/Throat:    Poor dentition throughout.  Affected tooth as diagrammed.  No signs of peritonsillar or tonsillar abscess.  No signs of gingival abscess. Oropharynx is clear and without exudates.  Uvula is midline.  Airway is intact. No signs of Ludwig's angina with palpation of oral and sublingual mucosa.   Eyes: Conjunctivae and EOM are normal.  Neck: Normal range of motion.  Cardiovascular: Normal rate.   Pulmonary/Chest: Effort normal.  Abdominal: She exhibits no distension.  Musculoskeletal: Normal range of motion.  Neurological: She is alert and oriented to person, place, and time.  Skin: Skin is dry.  Psychiatric: She has a normal mood and affect. Her behavior is normal. Judgment and thought content normal.  Nursing note and vitals reviewed.   ED Course  Dental Date/Time: 08/15/2014 6:04 PM Performed by: Montine Circle Authorized by: Montine Circle Consent: Verbal consent obtained. Risks and benefits: risks, benefits and alternatives were  discussed Consent given by: patient Patient understanding: patient states understanding of the procedure being performed Patient consent: the patient's understanding of the procedure matches consent given Procedure consent: procedure consent matches procedure scheduled Relevant documents: relevant documents present and verified Test results: test results available and properly labeled Site marked: the operative site was marked Imaging studies: imaging studies available Required items: required blood products,  implants, devices, and special equipment available Patient identity confirmed: verbally with patient Time out: Immediately prior to procedure a "time out" was called to verify the correct patient, procedure, equipment, support staff and site/side marked as required. Preparation: Patient was prepped and draped in the usual sterile fashion. Local anesthesia used: no Patient sedated: no Patient tolerance: Patient tolerated the procedure well with no immediate complications Comments: Application of CaOH to broken rear molar, pulp covered, patient tolerated the procedure well   (including critical care time)  DIAGNOSTIC STUDIES: Oxygen Saturation is 99% on RA, normal by my interpretation.    COORDINATION OF CARE: 5:17 PM Discussed treatment plan with pt at bedside and pt agreed to plan.   Labs Review Labs Reviewed - No data to display  Imaging Review No results found.   EKG Interpretation None      MDM   Final diagnoses:  Pain, dental    Patient with toothache.  No gross abscess.  Exam unconcerning for Ludwig's angina or spread of infection.  Will treat with penicillin and pain medicine.  Urged patient to follow-up with dentist.     Patient discussed with Dr. Geralynn Ochs, from dentistry.  Recommends applying CaOH.  I personally performed the services described in this documentation, which was scribed in my presence. The recorded information has been reviewed and is accurate.     Montine Circle, PA-C 08/15/14 1805  Richarda Blade, MD 08/16/14 [phone]

## 2014-08-19 ENCOUNTER — Other Ambulatory Visit: Payer: Self-pay | Admitting: Family Medicine

## 2014-08-19 DIAGNOSIS — N644 Mastodynia: Secondary | ICD-10-CM

## 2014-08-21 ENCOUNTER — Ambulatory Visit
Admission: RE | Admit: 2014-08-21 | Discharge: 2014-08-21 | Disposition: A | Discharge status: Home / Self Care | Payer: No Typology Code available for payment source | Source: Ambulatory Visit | Attending: Family Medicine | Admitting: Family Medicine

## 2014-08-21 ENCOUNTER — Encounter: Payer: Self-pay | Admitting: Family Medicine

## 2014-08-21 DIAGNOSIS — D242 Benign neoplasm of left breast: Secondary | ICD-10-CM | POA: Insufficient documentation

## 2014-08-21 DIAGNOSIS — N644 Mastodynia: Secondary | ICD-10-CM

## 2014-08-22 ENCOUNTER — Telehealth: Payer: Self-pay | Admitting: *Deleted

## 2014-08-22 NOTE — Telephone Encounter (Signed)
Pt aware of results 

## 2014-08-22 NOTE — Telephone Encounter (Signed)
-----   Message from Minerva Ends, MD sent at 08/21/2014  6:33 PM EST ----- Stable L breast fribroadenoma, f/u L breast US in 6 months

## 2015-04-27 ENCOUNTER — Ambulatory Visit: Payer: No Typology Code available for payment source | Attending: Family Medicine

## 2015-05-06 ENCOUNTER — Other Ambulatory Visit: Payer: Self-pay | Admitting: Family Medicine

## 2015-05-06 DIAGNOSIS — N644 Mastodynia: Secondary | ICD-10-CM

## 2015-05-11 ENCOUNTER — Ambulatory Visit
Admission: RE | Admit: 2015-05-11 | Discharge: 2015-05-11 | Disposition: A | Discharge status: Home / Self Care | Payer: No Typology Code available for payment source | Source: Ambulatory Visit | Attending: Family Medicine | Admitting: Family Medicine

## 2015-05-11 DIAGNOSIS — N644 Mastodynia: Secondary | ICD-10-CM

## 2015-05-14 ENCOUNTER — Other Ambulatory Visit: Payer: No Typology Code available for payment source | Admitting: Family Medicine

## 2015-06-02 ENCOUNTER — Other Ambulatory Visit: Payer: No Typology Code available for payment source | Admitting: Family Medicine

## 2015-11-27 ENCOUNTER — Ambulatory Visit: Payer: No Typology Code available for payment source | Attending: Family Medicine

## 2015-12-08 ENCOUNTER — Other Ambulatory Visit: Payer: Self-pay | Admitting: Family Medicine

## 2015-12-08 DIAGNOSIS — D242 Benign neoplasm of left breast: Secondary | ICD-10-CM

## 2015-12-14 ENCOUNTER — Encounter: Payer: Self-pay | Admitting: Family Medicine

## 2015-12-14 ENCOUNTER — Ambulatory Visit: Payer: Self-pay | Attending: Family Medicine | Admitting: Family Medicine

## 2015-12-14 VITALS — BP 133/81 | HR 78 | Temp 98.1°F | Resp 16 | Ht 68.0 in | Wt 211.0 lb

## 2015-12-14 DIAGNOSIS — N898 Other specified noninflammatory disorders of vagina: Secondary | ICD-10-CM | POA: Insufficient documentation

## 2015-12-14 DIAGNOSIS — Z6830 Body mass index (BMI) 30.0-30.9, adult: Secondary | ICD-10-CM | POA: Insufficient documentation

## 2015-12-14 DIAGNOSIS — E669 Obesity, unspecified: Secondary | ICD-10-CM | POA: Insufficient documentation

## 2015-12-14 DIAGNOSIS — Z79899 Other long term (current) drug therapy: Secondary | ICD-10-CM | POA: Insufficient documentation

## 2015-12-14 DIAGNOSIS — Z7982 Long term (current) use of aspirin: Secondary | ICD-10-CM | POA: Insufficient documentation

## 2015-12-14 MED ORDER — METRONIDAZOLE 500 MG PO TABS: 500.0000 mg | ORAL_TABLET | Freq: Two times a day (BID) | ORAL | Status: DC

## 2015-12-14 MED ORDER — FLUCONAZOLE 150 MG PO TABS: 150.0000 mg | ORAL_TABLET | Freq: Once | ORAL | Status: DC

## 2015-12-14 NOTE — Progress Notes (Signed)
Subjective:  Patient ID: Erin [name], female    DOB: [birth year]  Age: 30 y.o. MRN: [id number]  CC: Vaginal Discharge   HPI Erin [name] presents for    1. Vaginal discharge: x 5-6 months. White. Wearing a panty liner. Some odor. No rash, itching or unusual bleeding. She is intermittently sexually active. She uses condoms consistently. She has been pregnant once while she was taking OCPs. She prefers to not take birth control for now.   2. Overeating: eating high carb foods. Eats late a night. Gaining weight. Feels poorly. Plans to change in diet. Feels like she needs more discipline and self control.    Social History  Substance Use Topics  . Smoking status: Never Smoker   . Smokeless tobacco: Not on file  . Alcohol Use: Yes     Comment: Rare    Outpatient Prescriptions Prior to Visit  Medication Sig Dispense Refill  . albuterol (PROVENTIL HFA;VENTOLIN HFA) 108 (90 BASE) MCG/ACT inhaler Inhale 2 puffs into the lungs every 4 (four) hours as needed for wheezing or shortness of breath (cough, shortness of breath or wheezing.). 1 Inhaler 1  . acetaminophen (TYLENOL) 500 MG tablet Take 1,000 mg by mouth every 6 (six) hours as needed for mild pain. Reported on 12/14/2015    . aspirin 325 MG tablet Take 325 mg by mouth once as needed for mild pain. Reported on 12/14/2015    . HYDROcodone-acetaminophen (NORCO/VICODIN) 5-325 MG per tablet Take 1-2 tablets by mouth every 6 (six) hours as needed for moderate pain or severe pain. (Patient not taking: Reported on 12/14/2015) 10 tablet 0  . penicillin v potassium (VEETID) 500 MG tablet Take 1 tablet (500 mg total) by mouth 4 (four) times daily. (Patient not taking: Reported on 12/14/2015) 40 tablet 0   No facility-administered medications prior to visit.    ROS Review of Systems  Constitutional: Positive for appetite change (overeating ). Negative for fever and chills.  Eyes: Negative for visual disturbance.  Respiratory: Negative for  shortness of breath.   Cardiovascular: Negative for chest pain.  Gastrointestinal: Negative for abdominal pain and blood in stool.  Genitourinary: Positive for vaginal discharge.  Musculoskeletal: Negative for back pain and arthralgias.  Skin: Negative for rash.  Allergic/Immunologic: Negative for immunocompromised state.  Hematological: Negative for adenopathy. Does not bruise/bleed easily.  Psychiatric/Behavioral: Negative for suicidal ideas and dysphoric mood.    Objective:  BP 133/81 mmHg  Pulse 78  Temp(Src) 98.1 F (36.7 C) (Oral)  Resp 16  Ht 5\' 8"  (1.727 m)  Wt 211 lb (95.709 kg)  BMI 32.09 kg/m2  SpO2 100%  LMP 12/10/2015  BP/Weight 12/14/2015 08/15/2014 0000000  Systolic BP Q000111Q [id number] [id number]  Diastolic BP 81 99 95  Wt. (Lbs) 211 - 198  BMI 32.09 - 30.11   Physical Exam  Constitutional: She appears well-developed and well-nourished. No distress.  Cardiovascular: Normal rate, regular rhythm, normal heart sounds and intact distal pulses.   Pulmonary/Chest: Effort normal and breath sounds normal.  Genitourinary: Uterus normal. Pelvic exam was performed with patient prone. There is no rash, tenderness or lesion on the right labia. There is no rash, tenderness or lesion on the left labia. Cervix exhibits no motion tenderness, no discharge and no friability. Vaginal discharge (thin homogenous white vaginal discharge ) found.  Musculoskeletal: She exhibits no edema.  Lymphadenopathy:       Right: No inguinal adenopathy present.       Left: No inguinal adenopathy present.  Skin: Skin is warm and dry. No rash noted.   Depression screen [facility] [place] 2/9 12/14/2015 05/13/2014  Decreased Interest 0 0  Down, Depressed, Hopeless 0 0  PHQ - 2 Score 0 0  Altered sleeping 0 -  Tired, decreased energy 1 -  Change in appetite 3 -  Feeling bad or failure about yourself  0 -  Trouble concentrating 1 -  Moving slowly or fidgety/restless 0 -  Suicidal thoughts 0 -  PHQ-9 Score 5 -   GAD 7 :  Generalized Anxiety Score 12/14/2015  Nervous, Anxious, on Edge 0  Control/stop worrying 0  Worry too much - different things 0  Trouble relaxing 0  Restless 0  Easily annoyed or irritable 1  Afraid - awful might happen 0  Total GAD 7 Score 1    Assessment & Plan:   There are no diagnoses linked to this encounter. Naphtali was seen today for vaginal discharge.  Diagnoses and all orders for this visit:  Vaginal discharge -     Cervicovaginal ancillary only -     metroNIDAZOLE (FLAGYL) 500 MG tablet; Take 1 tablet (500 mg total) by mouth 2 (two) times daily. -     fluconazole (DIFLUCAN) 150 MG tablet; Take 1 tablet (150 mg total) by mouth once.  Obesity (BMI 30.0-34.9)   Meds ordered this encounter  Medications  . metroNIDAZOLE (FLAGYL) 500 MG tablet    Sig: Take 1 tablet (500 mg total) by mouth 2 (two) times daily.    Dispense:  14 tablet    Refill:  0  . fluconazole (DIFLUCAN) 150 MG tablet    Sig: Take 1 tablet (150 mg total) by mouth once.    Dispense:  1 tablet    Refill:  0    Follow-up: No Follow-up on file.   Boykin Nearing MD

## 2015-12-14 NOTE — Assessment & Plan Note (Signed)
Obesity Gaining weight Overeating   Plan: 1. 100 oz of water a day 2. Snack on veggies, cheese, nuts, berries, avocado

## 2015-12-14 NOTE — Patient Instructions (Addendum)
Delara was seen today for vaginal discharge.  Diagnoses and all orders for this visit:  Vaginal discharge -     Cervicovaginal ancillary only -     metroNIDAZOLE (FLAGYL) 500 MG tablet; Take 1 tablet (500 mg total) by mouth 2 (two) times daily. -     fluconazole (DIFLUCAN) 150 MG tablet; Take 1 tablet (150 mg total) by mouth once.  Bacterial Vaginosis Bacterial vaginosis is a vaginal infection that occurs when the normal balance of bacteria in the vagina is disrupted. It results from an overgrowth of certain bacteria. This is the most common vaginal infection in women of childbearing age. Treatment is important to prevent complications, especially in pregnant women, as it can cause a premature delivery. CAUSES  Bacterial vaginosis is caused by an increase in harmful bacteria that are normally present in smaller amounts in the vagina. Several different kinds of bacteria can cause bacterial vaginosis. However, the reason that the condition develops is not fully understood. RISK FACTORS Certain activities or behaviors can put you at an increased risk of developing bacterial vaginosis, including:  Having a new sex partner or multiple sex partners.  Douching.  Using an intrauterine device (IUD) for contraception. Women do not get bacterial vaginosis from toilet seats, bedding, swimming pools, or contact with objects around them. SIGNS AND SYMPTOMS  Some women with bacterial vaginosis have no signs or symptoms. Common symptoms include:  Grey vaginal discharge.  A fishlike odor with discharge, especially after sexual intercourse.  Itching or burning of the vagina and vulva.  Burning or pain with urination. DIAGNOSIS  Your health care provider will take a medical history and examine the vagina for signs of bacterial vaginosis. A sample of vaginal fluid may be taken. Your health care provider will look at this sample under a microscope to check for bacteria and abnormal cells. A vaginal pH  test may also be done.  TREATMENT  Bacterial vaginosis may be treated with antibiotic medicines. These may be given in the form of a pill or a vaginal cream. A second round of antibiotics may be prescribed if the condition comes back after treatment. Because bacterial vaginosis increases your risk for sexually transmitted diseases, getting treated can help reduce your risk for chlamydia, gonorrhea, HIV, and herpes. HOME CARE INSTRUCTIONS   Only take over-the-counter or prescription medicines as directed by your health care provider.  If antibiotic medicine was prescribed, take it as directed. Make sure you finish it even if you start to feel better.  Tell all sexual partners that you have a vaginal infection. They should see their health care provider and be treated if they have problems, such as a mild rash or itching.  During treatment, it is important that you follow these instructions:  Avoid sexual activity or use condoms correctly.  Do not douche.  Avoid alcohol as directed by your health care provider.  Avoid breastfeeding as directed by your health care provider. SEEK MEDICAL CARE IF:   Your symptoms are not improving after 3 days of treatment.  You have increased discharge or pain.  You have a fever. MAKE SURE YOU:   Understand these instructions.  Will watch your condition.  Will get help right away if you are not doing well or get worse. FOR MORE INFORMATION  Centers for Disease Control and Prevention, Division of STD Prevention: [employer] American Sexual Health Association (ASHA): www.ashastd.org    This information is not intended to replace advice given to you by your health care provider.  Make sure you discuss any questions you have with your health care provider.   Document Released: 06/27/2005 Document Revised: 07/18/2014 Document Reviewed: 02/06/2013 Elsevier Interactive Patient Education [employer].  F/u anytime if you would like to start  birth control  You will be called with results  Pap is due in March 2018  Dr. Adrian Blackwater

## 2015-12-14 NOTE — Assessment & Plan Note (Signed)
Suspect BV Treat with flagyl followed by diflucan

## 2015-12-14 NOTE — Progress Notes (Signed)
Vaginal discharge, yellow with odor x6 month  No pain today

## 2015-12-15 LAB — CERVICOVAGINAL ANCILLARY ONLY
Chlamydia: NEGATIVE
NEISSERIA GONORRHEA: NEGATIVE
WET PREP (BD AFFIRM): POSITIVE — AB

## 2015-12-16 ENCOUNTER — Ambulatory Visit
Admission: RE | Admit: 2015-12-16 | Discharge: 2015-12-16 | Disposition: A | Discharge status: Home / Self Care | Payer: No Typology Code available for payment source | Source: Ambulatory Visit | Attending: Family Medicine | Admitting: Family Medicine

## 2015-12-16 DIAGNOSIS — D242 Benign neoplasm of left breast: Secondary | ICD-10-CM

## 2015-12-17 ENCOUNTER — Telehealth: Payer: Self-pay | Admitting: *Deleted

## 2015-12-17 NOTE — Telephone Encounter (Signed)
-----   Message from Boykin Nearing, MD sent at 12/15/2015  8:58 AM EDT ----- Screening Gc/chlam negative

## 2015-12-17 NOTE — Telephone Encounter (Signed)
LVM to return call.

## 2015-12-17 NOTE — Telephone Encounter (Signed)
-----   Message from Boykin Nearing, MD sent at 12/15/2015  5:09 PM EDT ----- BV confirmed on wet prep

## 2016-01-19 NOTE — Telephone Encounter (Signed)
Attempted to advise patient per Dr Adrian Blackwater: "BV confirmed on wet prep"

## 2016-01-20 ENCOUNTER — Telehealth: Payer: Self-pay | Admitting: Family Medicine

## 2016-01-20 NOTE — Telephone Encounter (Signed)
Contacted patient to make her aware that bacterial vaginosis was confirmed on the wet prep and she has been treated for.

## 2016-01-20 NOTE — Telephone Encounter (Signed)
Pt. Returned call. Please f/u with pt. °

## 2016-12-16 ENCOUNTER — Encounter: Payer: Self-pay | Admitting: Gynecology

## 2016-12-16 ENCOUNTER — Ambulatory Visit (INDEPENDENT_AMBULATORY_CARE_PROVIDER_SITE_OTHER): Payer: No Typology Code available for payment source | Admitting: Gynecology

## 2016-12-16 VITALS — BP 130/78 | Ht 68.0 in | Wt 219.0 lb

## 2016-12-16 DIAGNOSIS — Z01411 Encounter for gynecological examination (general) (routine) with abnormal findings: Secondary | ICD-10-CM | POA: Diagnosis not present

## 2016-12-16 DIAGNOSIS — N898 Other specified noninflammatory disorders of vagina: Secondary | ICD-10-CM

## 2016-12-16 DIAGNOSIS — Z1151 Encounter for screening for human papillomavirus (HPV): Secondary | ICD-10-CM | POA: Diagnosis not present

## 2016-12-16 DIAGNOSIS — Z13220 Encounter for screening for lipoid disorders: Secondary | ICD-10-CM

## 2016-12-16 DIAGNOSIS — Z113 Encounter for screening for infections with a predominantly sexual mode of transmission: Secondary | ICD-10-CM | POA: Diagnosis not present

## 2016-12-16 LAB — CBC WITH DIFFERENTIAL/PLATELET
BASOS ABS: 0 {cells}/uL (ref 0–200)
Basophils Relative: 0 %
EOS ABS: 86 {cells}/uL (ref 15–500)
Eosinophils Relative: 2 %
HEMATOCRIT: 36.3 % (ref 35.0–45.0)
HEMOGLOBIN: 12.1 g/dL (ref 11.7–15.5)
LYMPHS ABS: 1849 {cells}/uL (ref 850–3900)
Lymphocytes Relative: 43 %
MCH: 30 pg (ref 27.0–33.0)
MCHC: 33.3 g/dL (ref 32.0–36.0)
MCV: 90.1 fL (ref 80.0–100.0)
MPV: 8.9 fL (ref 7.5–12.5)
Monocytes Absolute: 473 cells/uL (ref 200–950)
Monocytes Relative: 11 %
NEUTROS PCT: 44 %
Neutro Abs: 1892 cells/uL (ref 1500–7800)
Platelets: 363 10*3/uL (ref 140–400)
RBC: 4.03 MIL/uL (ref 3.80–5.10)
RDW: 13 % (ref 11.0–15.0)
WBC: 4.3 10*3/uL (ref 3.8–10.8)

## 2016-12-16 LAB — WET PREP FOR TRICH, YEAST, CLUE
TRICH WET PREP: NONE SEEN
YEAST WET PREP: NONE SEEN

## 2016-12-16 MED ORDER — METRONIDAZOLE 500 MG PO TABS: 500.0000 mg | ORAL_TABLET | Freq: Two times a day (BID) | ORAL | 0 refills | Status: DC

## 2016-12-16 MED ORDER — NORETHINDRONE ACET-ETHINYL EST 1-20 MG-MCG PO TABS: 1.0000 | ORAL_TABLET | Freq: Every day | ORAL | 11 refills | Status: DC

## 2016-12-16 NOTE — Addendum Note (Signed)
Addended by: Campbell Stall on: 12/16/2016 12:50 PM   Modules accepted: Orders

## 2016-12-16 NOTE — Patient Instructions (Signed)
Start on the birth control pills as we discussed. Call if you have any issues with that. Take the Flagyl medication twice daily for 7 days to treat the vaginal discharge. Avoid alcohol while taking.  Follow up for the IUD if you choose to do so.

## 2016-12-16 NOTE — Progress Notes (Signed)
    Erin [name] Jan 29, 1986 [id number]        30 y.o.  G1P0010 for annual exam.  Also complaining of several months of vaginal discharge with irritation. No urinary symptoms such as frequency dysuria or urgency low back pain fever or chills. No vaginal odor. Also wants to discuss birth control options.  Past medical history,surgical history, problem list, medications, allergies, family history and social history were all reviewed and documented as reviewed in the EPIC chart.  ROS:  Performed with pertinent positives and negatives included in the history, assessment and plan.   Additional significant findings :  None   Exam: Caryn Bee assistant Vitals:   12/16/16 1154  BP: 130/78  Weight: 219 lb (99.3 kg)  Height: 5\' 8"  (1.727 m)   Body mass index is 33.3 kg/m.  General appearance:  Normal affect, orientation and appearance. Skin: Grossly normal HEENT: Without gross lesions.  No cervical or supraclavicular adenopathy. Thyroid normal.  Lungs:  Clear without wheezing, rales or rhonchi Cardiac: RR, without RMG Abdominal:  Soft, nontender, without masses, guarding, rebound, organomegaly or hernia Breasts:  Examined lying and sitting without masses, retractions, discharge or axillary adenopathy. Pelvic:  Ext, BUS, Vagina: With frothy white discharge. Wet prep done  Cervix: Normal. Pap smear/HPV. GC/chlamydia screen  Uterus: Anteverted, normal size, shape and contour, midline and mobile nontender   Adnexa: Without masses or tenderness    Anus and perineum: Normal    Assessment/Plan:  31 y.o. G21P0010 female for annual exam with regular menses, condom contraception.   1. White frothy discharge with irritation. Wet prep consistent with bacterial vaginosis. Options for treatment reviewed. Patient elects for Flagyl 500 mg twice a day 7 days. Alcohol avoidance reviewed. Follow up if symptoms persist, worsen or recur. 2. STD screening. GC/Chlamydia screen done at her acceptance.  Patient declined serum screening. 3. Contraceptive management. Reviewed options to include pill, patch, ring, Nexplanon, IUDs, Depo-Provera. Pros and cons of each choice reviewed. Patient wants to go ahead and start low-dose oral contraceptives. Is not being followed for any significant medical issues and never smoked. Loestrin 1/20 equivalent prescribed 1 year. Sunday start after next menses. Backup contraception with condoms. Also reviewed Mirena IUD and she is going to think about this and follow up if she decides to do so. 4. Breast health. SBE monthly reviewed. 5. Health maintenance. Baseline CBC, CMP, lipid profile and urinalysis ordered. Follow up in one year, sooner as needed.  Additional time in excess of her routine gynecologic exam was spent in direct face to face counseling and coordination of care in regards to her bacterial vaginosis with treatment provided.   Anastasio Auerbach MD, 12:35 PM 12/16/2016

## 2016-12-17 LAB — URINALYSIS W MICROSCOPIC + REFLEX CULTURE
BACTERIA UA: NONE SEEN [HPF]
BILIRUBIN URINE: NEGATIVE
CRYSTALS: NONE SEEN [HPF]
Casts: NONE SEEN [LPF]
Glucose, UA: NEGATIVE
Hgb urine dipstick: NEGATIVE
KETONES UR: NEGATIVE
Nitrite: NEGATIVE
PROTEIN: NEGATIVE
Specific Gravity, Urine: 1.023 (ref 1.001–1.035)
Yeast: NONE SEEN [HPF]
pH: 5.5 (ref 5.0–8.0)

## 2016-12-17 LAB — COMPREHENSIVE METABOLIC PANEL
ALK PHOS: 43 U/L (ref 33–115)
ALT: 13 U/L (ref 6–29)
AST: 13 U/L (ref 10–30)
Albumin: 4 g/dL (ref 3.6–5.1)
BILIRUBIN TOTAL: 0.5 mg/dL (ref 0.2–1.2)
BUN: 9 mg/dL (ref 7–25)
CALCIUM: 8.9 mg/dL (ref 8.6–10.2)
CO2: 23 mmol/L (ref 20–31)
Chloride: 105 mmol/L (ref 98–110)
Creat: 0.76 mg/dL (ref 0.50–1.10)
Glucose, Bld: 86 mg/dL (ref 65–99)
Potassium: 3.9 mmol/L (ref 3.5–5.3)
Sodium: 138 mmol/L (ref 135–146)
Total Protein: 6.5 g/dL (ref 6.1–8.1)

## 2016-12-17 LAB — LIPID PANEL
Cholesterol: 169 mg/dL (ref <200)
HDL: 53 mg/dL (ref >50)
LDL Cholesterol: 105 mg/dL — ABNORMAL HIGH (ref <100)
Total CHOL/HDL Ratio: 3.2 Ratio (ref <5.0)
Triglycerides: 56 mg/dL (ref <150)
VLDL: 11 mg/dL (ref <30)

## 2016-12-18 LAB — URINE CULTURE: Organism ID, Bacteria: NO GROWTH

## 2016-12-20 LAB — PAP IG AND HPV HIGH-RISK: HPV DNA High Risk: NOT DETECTED

## 2016-12-21 LAB — GC/CHLAMYDIA PROBE AMP
CT PROBE, AMP APTIMA: NOT DETECTED
GC PROBE AMP APTIMA: NOT DETECTED

## 2017-01-03 ENCOUNTER — Ambulatory Visit (INDEPENDENT_AMBULATORY_CARE_PROVIDER_SITE_OTHER): Payer: No Typology Code available for payment source | Admitting: Physician Assistant

## 2017-01-03 ENCOUNTER — Encounter: Payer: Self-pay | Admitting: Physician Assistant

## 2017-01-03 VITALS — BP 129/81 | HR 90 | Temp 98.9°F | Resp 18 | Ht 68.0 in | Wt 220.2 lb

## 2017-01-03 DIAGNOSIS — L500 Allergic urticaria: Secondary | ICD-10-CM | POA: Diagnosis not present

## 2017-01-03 DIAGNOSIS — J4520 Mild intermittent asthma, uncomplicated: Secondary | ICD-10-CM | POA: Diagnosis not present

## 2017-01-03 DIAGNOSIS — J45909 Unspecified asthma, uncomplicated: Secondary | ICD-10-CM | POA: Insufficient documentation

## 2017-01-03 MED ORDER — RANITIDINE HCL 150 MG PO TABS: 150.0000 mg | ORAL_TABLET | Freq: Two times a day (BID) | ORAL | 0 refills | Status: DC

## 2017-01-03 MED ORDER — PREDNISONE 20 MG PO TABS: ORAL_TABLET | ORAL | 0 refills | Status: DC

## 2017-01-03 MED ORDER — ALBUTEROL SULFATE HFA 108 (90 BASE) MCG/ACT IN AERS: 2.0000 | INHALATION_SPRAY | RESPIRATORY_TRACT | 1 refills | Status: DC | PRN

## 2017-01-03 NOTE — Patient Instructions (Addendum)
Do NOT take last dose of Flagyl.  Take an over the counter non-drowsy antihistamine like Zyrtec or Claritin every morning until rash has resolved.  Take Benadryl in the evening every night until rash has resolved.  Take the Prednisone taper over the next 10 days as prescribed.  Take it easy and drink plenty of water over the next few days!  Please call 911 and go the emergency room if you start having any facial swelling, tongue/throat swelling or itching, difficulty breathing, or lightheadedness.  Feel better!!   IF you received an x-ray today, you will receive an invoice from [facility]. Please contact [facility] at [phone] with questions or concerns regarding your invoice.   IF you received labwork today, you will receive an invoice from [facility]. Please contact [facility] at [phone] with questions or concerns regarding your invoice.   Our billing staff will not be able to assist you with questions regarding bills from these companies.  You will be contacted with the lab results as soon as they are available. The fastest way to get your results is to activate your My Chart account. Instructions are located on the last page of this paperwork. If you have not heard from [place] regarding the results in 2 weeks, please contact this office.     Angioedema Angioedema is the sudden swelling of tissue in the body. Angioedema can affect any part of the body, but it most often affects the deeper parts of the skin, causing red, itchy patches (hives) to appear over the affected area. It often begins during the night and is found in the morning. Depending on the cause, angioedema may happen:  Only once.  Several times. It may come back in unpredictable patterns.  Repeatedly for several years. Over time, it may gradually stop coming back.  Angioedema can be life-threatening if it affects the air passages that you breathe through. What are the causes? This condition may  be caused by:  Foods, such as milk, eggs, shellfish, wheat, or nuts.  Certain medicines, such as ACE inhibitors, antibiotics, nonsteroidal anti-inflammatory drugs, birth control pills, or dyes used in X-rays.  Insect stings.  Infections.  Angioedema can be inherited, and episodes can be triggered by:  Mild injury.  Dental work.  Surgery.  Stress.  Sudden changes in temperature.  Exercise.  In some cases, the cause of this condition is not known. What are the signs or symptoms? Symptoms of this condition depend on where the swelling happens. Symptoms may include:  Swollen skin.  Red, itchy patches of skin (hives).  Redness in the affected area.  Pain in the affected area.  Swollen lips or tongue.  Wheezing.  Breathing problems.  If your internal organs are involved, symptoms may also include:  Nausea.  Abdominal pain.  Vomiting.  Difficulty swallowing.  Difficulty passing urine.  How is this diagnosed? This condition may be diagnosed based on:  An exam of the affected area.  Your medical history.  Whether anyone in your family has had this condition before.  A review of any medicines you have been taking.  Tests, including: ? Allergy skin tests to see if the condition was caused by an allergic reaction. ? Blood tests to see if the condition was caused by a gene. ? Tests to check for underlying diseases that could cause the condition.  How is this treated? Treatment for this condition depends on the cause. It may involve any of the following:  If something triggered the condition, making  changes to keep it from triggering the condition again.  If the condition affects your breathing, having tubes placed in your airway to keep it open.  Taking medicines to treat symptoms or prevent future episodes. These may include: ? Antihistamines. ? Epinephrine injections. ? Steroids.  If your condition is severe, you may need to be treated at the  hospital. Angioedema usually gets better in 24-48 hours. Follow these instructions at home:  Take over-the-counter and prescription medicines only as told by your health care provider.  If you were given medicines for emergency allergy treatment, always carry them with you.  Wear a medical bracelet as told by your health care provider.  If something triggers your condition, avoid the trigger, if possible.  If your condition is inherited and you are thinking about having children, talk to your health care provider. It is important to discuss the risks of passing on the condition to your children. Contact a health care provider if:  You have repeated episodes of angioedema.  Episodes of angioedema start to happen more often than they used to, even after you take steps to prevent them.  You have episodes of angioedema that are more severe than they have been before, even after you take steps to prevent them.  You are thinking about having children. Get help right away if:  You have severe swelling of your mouth, tongue, or lips.  You have trouble breathing.  You have trouble swallowing.  You faint. This information is not intended to replace advice given to you by your health care provider. Make sure you discuss any questions you have with your health care provider. Document Released: 09/05/2001 Document Revised: 01/23/2016 Document Reviewed: 01/05/2016 Elsevier Interactive Patient Education  [employer].

## 2017-01-03 NOTE — Progress Notes (Signed)
Patient ID: Erin [name], female    DOB: 04/26/1986, 31 y.o.   MRN: [id number]  PCP: Patient, No Pcp Per  Chief Complaint  Patient presents with  . Allergic Reaction    x3 days ago, per pt ate purple grapes, honeydew, vanilla yogurt when the reaction started, per pt is having pressure in between the breast, random breakouts mostly on her thighs, palms and bottom of feet are itchy.    Subjective:   Presents for evaluation of itchy rash, possibly an allergic reaction.  3 days ago, the patient consumed purple grapes, honeydew melon and vanilla yogurt, all foods she has previously consumed without problems. Shortly after, she developed itching of the low back. Since then she has developed itchy red lesions on the chest between the breasts, thighs, palms and soles.  The lesions come and go, and seem to "move around" to various locations, currently on the wrists, fingers, shoulder, neck and thighs.  No SOB or CP, but describes a sensation of pressure in the center of the chest. On clarification, this is in the epigastric area. Increased effort to breathe. Feels like gas pain. Is improving, but not resolved.  Took OTC diphenhydramine, last dose about 24 hours ago. Has also taken 2 doses of an antibiotic leftover from a previous dental extraction. No change with those doses.  Of note, she started a course of metronidazole for BV on 12/25/2016. She has one dose left. The vaginal symptoms have improved. Of note, this prescription was written for her on 6/08 and prescribed as a 7-day course.   Review of Systems Constitutional: Negative for chills and fever.  Eyes: Negative.   Respiratory: Positive for chest tightness. Negative for cough, shortness of breath and wheezing.   Cardiovascular: Negative for chest pain.  Gastrointestinal: Negative for abdominal pain, blood in stool, constipation, diarrhea, nausea and vomiting.  Endocrine: Negative.   Genitourinary: Negative.   Musculoskeletal:  Negative.   Skin: Positive for rash (pruritic).  Neurological: Negative for dizziness, syncope, facial asymmetry, weakness, light-headedness, numbness and headaches.  Hematological: Negative.   Psychiatric/Behavioral: Negative.      Patient Active Problem List   Diagnosis Date Noted  . Vaginal discharge 12/14/2015  . Obesity (BMI 30.0-34.9) 12/14/2015  . 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 Impacted molar 05/13/2014  . Dental cavities 05/13/2014     Prior to Admission medications   Medication Sig Start Date End Date Taking? Authorizing Provider  metroNIDAZOLE (FLAGYL) 500 MG tablet Take 1 tablet (500 mg total) by mouth 2 (two) times daily. 12/16/16  Yes Fontaine, Belinda Block, MD  acetaminophen (TYLENOL) 500 MG tablet Take 1,000 mg by mouth every 6 (six) hours as needed for mild pain. Reported on 12/14/2015    [provider]  albuterol (PROVENTIL HFA;VENTOLIN HFA) 108 (90 BASE) MCG/ACT inhaler Inhale 2 puffs into the lungs every 4 (four) hours as needed for wheezing or shortness of breath (cough, shortness of breath or wheezing.). Patient not taking: Reported on 01/03/2017 02/14/14   Elby Beck, FNP  aspirin 325 MG tablet Take 325 mg by mouth once as needed for mild pain. Reported on 12/14/2015    [provider]  norethindrone-ethinyl estradiol (MICROGESTIN,JUNEL,LOESTRIN) 1-20 MG-MCG tablet Take 1 tablet by mouth daily. Patient not taking: Reported on 01/03/2017 12/16/16   Fontaine, Belinda Block, MD     No Known Allergies     Objective:  Physical Exam  Constitutional: She is oriented to person, place, and time.  She appears well-developed and well-nourished. She is active and cooperative. No distress.  BP 129/81 (BP Location: Right Arm, Patient Position: Sitting, Cuff Size: Large)   Pulse 90   Temp 98.9 F (37.2 C) (Oral)   Resp 18   Ht 5\' 8"  (1.727 m)   Wt 220 lb 3.2 oz (99.9 kg)   LMP 11/27/2016   SpO2 100%   BMI 33.48 kg/m   HENT:  Head:  Normocephalic and atraumatic.  Right Ear: Hearing normal.  Left Ear: Hearing normal.  Eyes: Conjunctivae are normal. No scleral icterus.  Neck: Normal range of motion. Neck supple. No thyromegaly present.  Cardiovascular: Normal rate, regular rhythm and normal heart sounds.   Pulses:      Radial pulses are 2+ on the right side, and 2+ on the left side.  Pulmonary/Chest: Effort normal and breath sounds normal.  Lymphadenopathy:       Head (right side): No tonsillar, no preauricular, no posterior auricular and no occipital adenopathy present.       Head (left side): No tonsillar, no preauricular, no posterior auricular and no occipital adenopathy present.    She has no cervical adenopathy.       Right: No supraclavicular adenopathy present.       Left: No supraclavicular adenopathy present.  Neurological: She is alert and oriented to person, place, and time. No sensory deficit.  Skin: Skin is warm, dry and intact. Rash (scattered, confluent on the upper thighs, spare the palms and soles) noted. Rash is urticarial. No cyanosis. Nails show no clubbing.  Psychiatric: She has a normal mood and affect. Her speech is normal and behavior is normal.           Assessment & Plan:   1. Allergic urticaria Do not take the last dose of metronidazole. Use OTC non-sedating oral antihistamine QAM, diphenhydramine QHS. Oral H2 blocker. Oral steroid taper. - predniSONE (DELTASONE) 20 MG tablet; Take 3 PO QAM x3days, 2 PO QAM x3days, 1 PO QAM x3days  Dispense: 18 tablet; Refill: 0 - ranitidine (ZANTAC) 150 MG tablet; Take 1 tablet (150 mg total) by mouth 2 (two) times daily.  Dispense: 30 tablet; Refill: 0  2. Intermittent asthma without complication, unspecified asthma severity Requests a refill, as her current inhaler is expired.  - albuterol (PROVENTIL HFA;VENTOLIN HFA) 108 (90 Base) MCG/ACT inhaler; Inhale 2 puffs into the lungs every 4 (four) hours as needed for wheezing or shortness of breath  (cough, shortness of breath or wheezing.).  Dispense: 1 Inhaler; Refill: 1    Return if symptoms worsen or fail to improve.   Fara Chute, PA-C [facility] [place]

## 2017-01-03 NOTE — Progress Notes (Signed)
Subjective:    Patient ID: Erin [name], female    DOB: 21-Mar-1986, 31 y.o.   MRN: [id number] PCP: Patient, No Pcp Per   HPI: 31 y/o F presents complaining of a possible allergic reaction, she is in no acute distress.Marland Kitchen Purple grapes and honey dew melon with some yogurt on top. Ate it 2 days ago and shortly after developed itching on low back. Since then has intermittently developed rash in various places on body including thighs, arms, face, trunk and neck. Currently on wrists, neck, shoulder, fingers, and the worst on her thighs.  Feels pressures in between breasts. Feels like she has to try harder to breath in. Started 2 nights ago. Thought it was gas. Not as intense as it was then but it is still there.  Throat and tongue were itchy yesterday. Benadryl relieved that.  Last time took benadryl was around 4 pm yesterday.  10pm last night and 11 am today took an antibiotic that she was previously prescribed for a tooth extraction, she does not remember what it is called. It not did help or worse her symptoms.  Started taking Flagyl for BV on 6/17. Has one more dose left. Vaginal discharge and irriation have improved.    Patient Active Problem List   Diagnosis Date Noted  . Vaginal discharge 12/14/2015  . Obesity (BMI 30.0-34.9) 12/14/2015  . 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 Impacted molar 05/13/2014  . Dental cavities 05/13/2014   Past Medical History:  Diagnosis Date  . 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 Asthma   . Migraine    Prior to Admission medications   Medication Sig Start Date End Date Taking? Authorizing Provider  acetaminophen (TYLENOL) 500 MG tablet Take 1,000 mg by mouth every 6 (six) hours as needed for mild pain. Reported on 12/14/2015    [provider]  albuterol (PROVENTIL HFA;VENTOLIN HFA) 108 (90 BASE) MCG/ACT inhaler Inhale 2 puffs into the lungs every 4 (four) hours as needed for wheezing or shortness of breath (cough,  shortness of breath or wheezing.). 02/14/14   Elby Beck, FNP  aspirin 325 MG tablet Take 325 mg by mouth once as needed for mild pain. Reported on 12/14/2015    [provider]  metroNIDAZOLE (FLAGYL) 500 MG tablet Take 1 tablet (500 mg total) by mouth 2 (two) times daily. 12/16/16   Fontaine, Belinda Block, MD  norethindrone-ethinyl estradiol (MICROGESTIN,JUNEL,LOESTRIN) 1-20 MG-MCG tablet Take 1 tablet by mouth daily. 12/16/16   Fontaine, Belinda Block, MD   No Known Allergies  Review of Systems  Constitutional: Negative for chills and fever.  Eyes: Negative.   Respiratory: Positive for chest tightness. Negative for cough, shortness of breath and wheezing.   Cardiovascular: Negative for chest pain.  Gastrointestinal: Negative for abdominal pain, blood in stool, constipation, diarrhea, nausea and vomiting.  Endocrine: Negative.   Genitourinary: Negative.   Musculoskeletal: Negative.   Skin: Positive for rash (pruritic).  Neurological: Negative for dizziness, syncope, facial asymmetry, weakness, light-headedness, numbness and headaches.  Hematological: Negative.   Psychiatric/Behavioral: Negative.        Objective:   Physical Exam  Constitutional: She is oriented to person, place, and time. She appears well-developed and well-nourished.  Non-toxic appearance. She does not have a sickly appearance. She does not appear ill. No distress.  BP 129/81 (BP Location: Right Arm, Patient Position: Sitting, Cuff Size: Large)   Pulse 90   Temp 98.9 F (37.2 C) (Oral)  Resp 18   Ht 5\' 8"  (1.727 m)   Wt 220 lb 3.2 oz (99.9 kg)   LMP 11/27/2016   SpO2 100%   BMI 33.48 kg/m    HENT:  Head: Normocephalic and atraumatic.  Eyes: Conjunctivae and EOM are normal. Pupils are equal, round, and reactive to light.  Neck: Normal range of motion. Neck supple. No thyromegaly present.  Cardiovascular: Normal rate, regular rhythm, normal heart sounds and intact distal pulses.   Pulmonary/Chest:  Effort normal and breath sounds normal. No stridor. No respiratory distress. She has no wheezes.  Lymphadenopathy:    She has no cervical adenopathy.  Neurological: She is alert and oriented to person, place, and time.  Skin: Skin is warm and dry. Rash (urticarial  raised maculopapular erythematous rash with mild central clearing ) noted. She is not diaphoretic.  Negative for dermatographia  Psychiatric: Her behavior is normal. Judgment and thought content normal.      Assessment & Plan:  1. Allergic urticaria Do not take last dose of Flagyl. Take Zyrtec, or other OTC non-drowsy antihistamine in the morning benadryl in the evening for allergic reaction. Take prednisone taper as prescribed for itching and urticaria. Take Zantac for GI upset associated with prednisone usage, and additional histamine coverage.  - predniSONE (DELTASONE) 20 MG tablet; Take 3 PO QAM x3days, 2 PO QAM x3days, 1 PO QAM x3days  Dispense: 18 tablet; Refill: 0 - ranitidine (ZANTAC) 150 MG tablet; Take 1 tablet (150 mg total) by mouth 2 (two) times daily.  Dispense: 30 tablet; Refill: 0  2. Intermittent asthma without complication, unspecified asthma severity Condition well controlled.  - albuterol (PROVENTIL HFA;VENTOLIN HFA) 108 (90 Base) MCG/ACT inhaler; Inhale 2 puffs into the lungs every 4 (four) hours as needed for wheezing or shortness of breath (cough, shortness of breath or wheezing.).  Dispense: 1 Inhaler; Refill: 1   Return if symptoms worsen or fail to improve.

## 2017-04-18 ENCOUNTER — Encounter: Payer: Self-pay | Admitting: Family Medicine

## 2017-04-18 ENCOUNTER — Ambulatory Visit (INDEPENDENT_AMBULATORY_CARE_PROVIDER_SITE_OTHER): Payer: No Typology Code available for payment source | Admitting: Family Medicine

## 2017-04-18 VITALS — BP 132/70 | HR 91 | Temp 99.0°F | Resp 16 | Ht 68.0 in | Wt 226.0 lb

## 2017-04-18 DIAGNOSIS — K649 Unspecified hemorrhoids: Secondary | ICD-10-CM | POA: Diagnosis not present

## 2017-04-18 MED ORDER — HYDROCORTISONE ACETATE 25 MG RE SUPP: 25.0000 mg | Freq: Two times a day (BID) | RECTAL | 0 refills | Status: DC

## 2017-04-18 NOTE — Progress Notes (Signed)
   10/9/20181:57 PM  Erin [name] 1985/10/07, 31 y.o. female [id number]  Chief Complaint  Patient presents with  . Blood In Stools    onset: yesterday around 6 pm after getting home noticed bright red blood in stools and also in this morning's stool as well.  No constipation or pain, regular bm's daily.    HPI:   Patient is a 31 y.o. female who presents today for bright red blood x 2 since yesterday only when she has a BM. Denies pain with BM, constipation, abd pain, nausea, vomiting, fever, chills, changes in appetite or weight. Has never happened before. Denies fhx of IBD or CC.  Depression screen [facility] 2/9 04/18/2017 01/03/2017 12/14/2015  Decreased Interest 0 0 0  Down, Depressed, Hopeless 0 0 0  PHQ - 2 Score 0 0 0  Altered sleeping - - 0  Tired, decreased energy - - 1  Change in appetite - - 3  Feeling bad or failure about yourself  - - 0  Trouble concentrating - - 1  Moving slowly or fidgety/restless - - 0  Suicidal thoughts - - 0  PHQ-9 Score - - 5    No Known Allergies  Prior to Admission medications   Medication Sig Start Date End Date Taking? Authorizing Provider    Past Medical History:  Diagnosis Date  . 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 Asthma   . Migraine     Past Surgical History:  Procedure Laterality Date  . COLPOSCOPY  2011   negative Bx  . DENTAL RESTORATION/EXTRACTION WITH X-RAY      Social History  Substance Use Topics  . Smoking status: Never Smoker  . Smokeless tobacco: Never Used  . Alcohol use Yes     Comment: Rare    Family History  Problem Relation Age of Onset  . Hypertension Mother     ROS Per hpi  OBJECTIVE:  Blood pressure 132/70, pulse 91, temperature 99 F (37.2 C), temperature source Oral, resp. rate 16, height 5\' 8"  (1.727 m), weight 226 lb (102.5 kg), last menstrual period 03/04/2017, SpO2 98 %.  Physical Exam  Constitutional: She is oriented to person, place, and time and well-developed, well-nourished,  and in no distress.  HENT:  Head: Normocephalic and atraumatic.  Mouth/Throat: Oropharynx is clear and moist. No oropharyngeal exudate.  Eyes: Pupils are equal, round, and reactive to light. EOM are normal. No scleral icterus.  Neck: Neck supple.  Cardiovascular: Normal rate, regular rhythm and normal heart sounds.  Exam reveals no gallop and no friction rub.   No murmur heard. Pulmonary/Chest: Effort normal and breath sounds normal. She has no wheezes. She has no rales.  Abdominal: Soft. Bowel sounds are normal. She exhibits no distension and no mass. There is no tenderness.  Genitourinary: Rectal exam shows internal hemorrhoid and tenderness. Rectal exam shows no external hemorrhoid, no fissure and no mass.  Musculoskeletal: She exhibits no edema.  Neurological: She is alert and oriented to person, place, and time. Gait normal.  Skin: Skin is warm and dry.    ASSESSMENT and PLAN  1. Hemorrhoids, unspecified hemorrhoid type Discussed medication, conservative measures, rtc precautions. Patient educational handout given.  - hydrocortisone (ANUSOL-HC) 25 MG suppository; Place 1 suppository (25 mg total) rectally 2 (two) times daily.    No Follow-up on file.    Rutherford Guys, MD [facility] [place], [place] [address] Ph.  [phone] Fax [phone]

## 2017-04-18 NOTE — Patient Instructions (Addendum)
IF you received an x-ray today, you will receive an invoice from [facility]. Please contact [facility] at [phone] with questions or concerns regarding your invoice.   IF you received labwork today, you will receive an invoice from [facility]. Please contact [facility] at [phone] with questions or concerns regarding your invoice.   Our billing staff will not be able to assist you with questions regarding bills from these companies.  You will be contacted with the lab results as soon as they are available. The fastest way to get your results is to activate your My Chart account. Instructions are located on the last page of this paperwork. If you have not heard from [place] regarding the results in 2 weeks, please contact this office.     Hemorrhoids Hemorrhoids are swollen veins in and around the rectum or anus. There are two types of hemorrhoids:  Internal hemorrhoids. These occur in the veins that are just inside the rectum. They may poke through to the outside and become irritated and painful.  External hemorrhoids. These occur in the veins that are outside of the anus and can be felt as a painful swelling or hard lump near the anus.  Most hemorrhoids do not cause serious problems, and they can be managed with home treatments such as diet and lifestyle changes. If home treatments do not help your symptoms, procedures can be done to shrink or remove the hemorrhoids. What are the causes? This condition is caused by increased pressure in the anal area. This pressure may result from various things, including:  Constipation.  Straining to have a bowel movement.  Diarrhea.  Pregnancy.  Obesity.  Sitting for long periods of time.  Heavy lifting or other activity that causes you to strain.  Anal sex.  What are the signs or symptoms? Symptoms of this condition include:  Pain.  Anal itching or irritation.  Rectal bleeding.  Leakage of stool  (feces).  Anal swelling.  One or more lumps around the anus.  How is this diagnosed? This condition can often be diagnosed through a visual exam. Other exams or tests may also be done, such as:  Examination of the rectal area with a gloved hand (digital rectal exam).  Examination of the anal canal using a small tube (anoscope).  A blood test, if you have lost a significant amount of blood.  A test to look inside the colon (sigmoidoscopy or colonoscopy).  How is this treated? This condition can usually be treated at home. However, various procedures may be done if dietary changes, lifestyle changes, and other home treatments do not help your symptoms. These procedures can help make the hemorrhoids smaller or remove them completely. Some of these procedures involve surgery, and others do not. Common procedures include:  Rubber band ligation. Rubber bands are placed at the base of the hemorrhoids to cut off the blood supply to them.  Sclerotherapy. Medicine is injected into the hemorrhoids to shrink them.  Infrared coagulation. A type of light energy is used to get rid of the hemorrhoids.  Hemorrhoidectomy surgery. The hemorrhoids are surgically removed, and the veins that supply them are tied off.  Stapled hemorrhoidopexy surgery. A circular stapling device is used to remove the hemorrhoids and use staples to cut off the blood supply to them.  Follow these instructions at home: Eating and drinking  Eat foods that have a lot of fiber in them, such as whole grains, beans, nuts, fruits, and vegetables. Ask your health care  provider about taking products that have added fiber (fiber supplements).  Drink enough fluid to keep your urine clear or pale yellow. Managing pain and swelling  Take warm sitz baths for 20 minutes, 3-4 times a day to ease pain and discomfort.  If directed, apply ice to the affected area. Using ice packs between sitz baths may be helpful. ? Put ice in a plastic  bag. ? Place a towel between your skin and the bag. ? Leave the ice on for 20 minutes, 2-3 times a day. General instructions  Take over-the-counter and prescription medicines only as told by your health care provider.  Use medicated creams or suppositories as told.  Exercise regularly.  Go to the bathroom when you have the urge to have a bowel movement. Do not wait.  Avoid straining to have bowel movements.  Keep the anal area dry and clean. Use wet toilet paper or moist towelettes after a bowel movement.  Do not sit on the toilet for long periods of time. This increases blood pooling and pain. Contact a health care provider if:  You have increasing pain and swelling that are not controlled by treatment or medicine.  You have uncontrolled bleeding.  You have difficulty having a bowel movement, or you are unable to have a bowel movement.  You have pain or inflammation outside the area of the hemorrhoids. This information is not intended to replace advice given to you by your health care provider. Make sure you discuss any questions you have with your health care provider. Document Released: 06/24/2000 Document Revised: 11/25/2015 Document Reviewed: 03/11/2015 Elsevier Interactive Patient Education  2017 [employer].

## 2017-10-12 ENCOUNTER — Encounter: Payer: Self-pay | Admitting: Physician Assistant

## 2019-03-26 ENCOUNTER — Telehealth: Payer: Self-pay | Admitting: Nurse Practitioner

## 2019-03-26 NOTE — Telephone Encounter (Signed)
Called the patient to verify insurance as she was placed on the waitlist. Unable to contact the patient however scheduled the appointment as promised and mailing an appointment reminder in the mail.

## 2019-04-01 ENCOUNTER — Encounter: Payer: Self-pay | Admitting: Gynecology

## 2019-04-11 ENCOUNTER — Encounter: Payer: Self-pay | Admitting: Obstetrics and Gynecology

## 2019-07-04 ENCOUNTER — Ambulatory Visit: Payer: HRSA Program | Attending: Internal Medicine

## 2019-07-04 DIAGNOSIS — R238 Other skin changes: Secondary | ICD-10-CM | POA: Diagnosis present

## 2019-07-04 DIAGNOSIS — U071 COVID-19: Secondary | ICD-10-CM | POA: Diagnosis not present

## 2019-07-06 LAB — NOVEL CORONAVIRUS, NAA: SARS-CoV-2, NAA: DETECTED — AB

## 2019-07-08 ENCOUNTER — Ambulatory Visit: Payer: Self-pay | Attending: Internal Medicine

## 2019-07-10 ENCOUNTER — Encounter: Payer: Self-pay | Admitting: Infectious Diseases

## 2019-07-17 ENCOUNTER — Other Ambulatory Visit: Payer: Self-pay

## 2019-07-18 ENCOUNTER — Ambulatory Visit: Payer: No Typology Code available for payment source | Attending: Internal Medicine

## 2019-07-18 DIAGNOSIS — Z20822 Contact with and (suspected) exposure to covid-19: Secondary | ICD-10-CM | POA: Insufficient documentation

## 2019-07-20 LAB — NOVEL CORONAVIRUS, NAA: SARS-CoV-2, NAA: NOT DETECTED

## 2020-04-21 ENCOUNTER — Ambulatory Visit: Payer: No Typology Code available for payment source | Admitting: Registered Nurse

## 2020-04-22 ENCOUNTER — Encounter: Payer: Self-pay | Admitting: Registered Nurse

## 2020-10-29 ENCOUNTER — Other Ambulatory Visit: Payer: Self-pay

## 2020-10-29 ENCOUNTER — Encounter (HOSPITAL_COMMUNITY): Payer: Self-pay

## 2020-10-29 ENCOUNTER — Ambulatory Visit (HOSPITAL_COMMUNITY)
Admission: EM | Admit: 2020-10-29 | Discharge: 2020-10-29 | Disposition: A | Discharge status: Home / Self Care | Payer: 59 | Attending: Student | Admitting: Student

## 2020-10-29 DIAGNOSIS — G43701 Chronic migraine without aura, not intractable, with status migrainosus: Secondary | ICD-10-CM

## 2020-10-29 DIAGNOSIS — R112 Nausea with vomiting, unspecified: Secondary | ICD-10-CM | POA: Diagnosis not present

## 2020-10-29 MED ORDER — ONDANSETRON 8 MG PO TBDP: 8.0000 mg | ORAL_TABLET | Freq: Three times a day (TID) | ORAL | 0 refills | Status: DC | PRN

## 2020-10-29 NOTE — ED Provider Notes (Signed)
[facility]    CSN: [id number] Arrival date & time: 10/29/20  1711      History   Chief Complaint Chief Complaint  Patient presents with  . Headache  . Nausea    HPI Norman Bier is a 35 y.o. female presenting with migraine headaches and nausea.  Medical history asthma, migraines.  Notes history of migraine headaches that are currently poorly controlled on no medications.  States she is having a migraine almost every other day.  This current migraine has lasted about 1 day.  She did take Tylenol at onset 24 hours ago, but has not taken any medications for this today.  Endorses photophobia and phonophobia, as well as nausea with one episode of vomiting. Denies worst headache of life, thunderclap headache, weakness/sensation changes in arms/legs, vision changes, shortness of breath, chest pain/pressure.    HPI  Past Medical History:  Diagnosis Date  . 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 Asthma   . Migraine     Patient Active Problem List   Diagnosis Date Noted  . Asthma 01/03/2017  . Vaginal discharge 12/14/2015  . Obesity (BMI 30.0-34.9) 12/14/2015  . 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 Impacted molar 05/13/2014  . Dental cavities 05/13/2014    Past Surgical History:  Procedure Laterality Date  . COLPOSCOPY  2011   negative Bx  . DENTAL RESTORATION/EXTRACTION WITH X-RAY      OB History    Gravida  1   Para      Term      Preterm      AB  1   Living        SAB  1   IAB      Ectopic      Multiple      Live Births               Home Medications    Prior to Admission medications   Medication Sig Start Date End Date Taking? Authorizing Provider  ondansetron (ZOFRAN ODT) 8 MG disintegrating tablet Take 1 tablet (8 mg total) by mouth every 8 (eight) hours as needed for nausea or vomiting. 10/29/20  Yes Hazel Sams, PA-C  hydrocortisone (ANUSOL-HC) 25 MG suppository Place 1 suppository (25 mg total)  rectally 2 (two) times daily. 04/18/17   Daleen Squibb, MD    Family History Family History  Problem Relation Age of Onset  . Hypertension Mother     Social History Social History   Tobacco Use  . Smoking status: Never Smoker  . Smokeless tobacco: Never Used  Vaping Use  . Vaping Use: Never used  Substance Use Topics  . Alcohol use: Yes    Comment: Rare  . Drug use: Not Currently    Types: Marijuana    Comment: marijuana every other day     Allergies   Patient has no known allergies.   Review of Systems Review of Systems  Constitutional: Negative for appetite change, chills and fever.  HENT: Negative for congestion, ear pain, rhinorrhea, sinus pressure, sinus pain and sore throat.   Eyes: Positive for photophobia. Negative for redness and visual disturbance.  Respiratory: Negative for cough, chest tightness, shortness of breath and wheezing.   Cardiovascular: Negative for chest pain and palpitations.  Gastrointestinal: Positive for nausea. Negative for abdominal pain, constipation, diarrhea and vomiting.  Genitourinary: Negative for dysuria, frequency and urgency.  Musculoskeletal: Negative for myalgias.  Neurological: Positive for headaches. Negative  for dizziness and weakness.  Psychiatric/Behavioral: Negative for confusion.  All other systems reviewed and are negative.    Physical Exam Triage Vital Signs ED Triage Vitals  Enc Vitals Group     BP      Pulse      Resp      Temp      Temp src      SpO2      Weight      Height      Head Circumference      Peak Flow      Pain Score      Pain Loc      Pain Edu?      Excl. in [place]?    No data found.  Updated Vital Signs BP (S) (!) 145/88 (BP Location: Right Arm)   Pulse 77   Temp 98.3 F (36.8 C) (Oral)   Resp 16   LMP 10/23/2020   SpO2 99%   Visual Acuity Right Eye Distance:   Left Eye Distance:   Bilateral Distance:    Right Eye Near:   Left Eye Near:    Bilateral Near:      Physical Exam Vitals reviewed.  Constitutional:      General: She is not in acute distress.    Appearance: Normal appearance. She is not ill-appearing.  HENT:     Head: Normocephalic and atraumatic.     Right Ear: Hearing, tympanic membrane, ear canal and external ear normal. No swelling or tenderness. There is no impacted cerumen. No mastoid tenderness. Tympanic membrane is not perforated, erythematous, retracted or bulging.     Left Ear: Hearing, tympanic membrane, ear canal and external ear normal. No swelling or tenderness. There is no impacted cerumen. No mastoid tenderness. Tympanic membrane is not perforated, erythematous, retracted or bulging.     Nose:     Right Sinus: No maxillary sinus tenderness or frontal sinus tenderness.     Left Sinus: No maxillary sinus tenderness or frontal sinus tenderness.     Mouth/Throat:     Mouth: Mucous membranes are moist.     Pharynx: Uvula midline. No oropharyngeal exudate or posterior oropharyngeal erythema.     Tonsils: No tonsillar exudate.  Eyes:     Extraocular Movements: Extraocular movements intact.     Pupils: Pupils are equal, round, and reactive to light.  Cardiovascular:     Rate and Rhythm: Normal rate and regular rhythm.     Heart sounds: Normal heart sounds.  Pulmonary:     Effort: Pulmonary effort is normal.     Breath sounds: Normal breath sounds and air entry. No wheezing, rhonchi or rales.  Chest:     Chest wall: No tenderness.  Abdominal:     General: Abdomen is flat. Bowel sounds are normal.     Tenderness: There is no abdominal tenderness. There is no guarding or rebound.  Musculoskeletal:     Cervical back: Normal range of motion and neck supple. No rigidity.  Lymphadenopathy:     Cervical: No cervical adenopathy.  Skin:    Capillary Refill: Capillary refill takes less than 2 seconds.  Neurological:     General: No focal deficit present.     Mental Status: She is alert and oriented to person, place, and time.  Mental status is at baseline.     Cranial Nerves: Cranial nerves are intact. No cranial nerve deficit or facial asymmetry.     Sensory: Sensation is intact. No sensory deficit.  Motor: Motor function is intact. No weakness.     Coordination: Coordination is intact. Coordination normal.     Gait: Gait is intact. Gait normal.     Comments: CN 2-12 intact. No weakness or numbness in UEs or LEs.  Psychiatric:        Attention and Perception: Attention and perception normal.        Mood and Affect: Mood and affect normal.        Behavior: Behavior normal. Behavior is cooperative.        Thought Content: Thought content normal.        Judgment: Judgment normal.      UC Treatments / Results  Labs (all labs ordered are listed, but only abnormal results are displayed) Labs Reviewed - No data to display  EKG   Radiology No results found.  Procedures Procedures (including critical care time)  Medications Ordered in UC Medications - No data to display  Initial Impression / Assessment and Plan / UC Course  I have reviewed the triage vital signs and the nursing notes.  Pertinent labs & imaging results that were available during my care of the patient were reviewed by me and considered in my medical decision making (see chart for details).     This patient is a 35 year old female presenting with chronic migraines, with current migraine. Neuro exam benign. No red flag symptoms.   Has not taken any medications for her migraine today.  Recommended Tylenol and ibuprofen.  Discussed option of Toradol today, which she declines.  She states she cannot be pregnant.  Discussed option of follow-up with neurology for persistent migraines, information provided. ED return precautions discussed.  Final Clinical Impressions(s) / UC Diagnoses   Final diagnoses:  Chronic migraine without aura with status migrainosus, not intractable  Non-intractable vomiting with nausea, unspecified vomiting  type     Discharge Instructions     -Take the Zofran (ondansetron) up to 3 times daily for nausea and vomiting. Dissolve one pill under your tongue or between your teeth and your cheek. -For headaches, Take Tylenol 1000 mg 3 times daily, and ibuprofen 800 mg 3 times daily with food.  You can take these together, or alternate every 3-4 hours. -Follow-up with neurology for further evaluation and management of chronic migraine headaches. Information below, or your PCP can send a referral. -If you develop the worst headache of your life, especially if this is associated with vision changes, weakness in arms or legs, shortness of breath-had straight to the emergency department.    ED Prescriptions    Medication Sig Dispense Auth. Provider   ondansetron (ZOFRAN ODT) 8 MG disintegrating tablet Take 1 tablet (8 mg total) by mouth every 8 (eight) hours as needed for nausea or vomiting. 20 tablet Hazel Sams, PA-C     PDMP not reviewed this encounter.   Vincenza, Dail, PA-C 10/29/20 1807

## 2020-10-29 NOTE — ED Triage Notes (Signed)
Patient presents to Urgent Care with complaints of headache since yesterday and nauseavomiting today. Pt states she has a hx of migraines but not on any meds. She reports this feels like a migraine since she is exp sensitivity to light and sound.  Denies fever, changes in vision.

## 2020-10-29 NOTE — Discharge Instructions (Addendum)
-  Take the Zofran (ondansetron) up to 3 times daily for nausea and vomiting. Dissolve one pill under your tongue or between your teeth and your cheek. -For headaches, Take Tylenol 1000 mg 3 times daily, and ibuprofen 800 mg 3 times daily with food.  You can take these together, or alternate every 3-4 hours. -Follow-up with neurology for further evaluation and management of chronic migraine headaches. Information below, or your PCP can send a referral. -If you develop the worst headache of your life, especially if this is associated with vision changes, weakness in arms or legs, shortness of breath-had straight to the emergency department.

## 2020-10-30 ENCOUNTER — Encounter: Payer: Self-pay | Admitting: Neurology

## 2020-11-30 NOTE — Progress Notes (Signed)
NEUROLOGY CONSULTATION NOTE  Erin [name] MRN: [id number] DOB: 05/03/1986  Referring provider: Marin Roberts, PA-C (ED referral) Primary care provider: No PCP  Reason for consult:  headache  Assessment/Plan:   Migraine without aura, without status migrainosus, not intractable  1.  Migraine prevention:  Lifestyle modification, possibly Mg, B2, CoQ10 2.  Migraine rescue:  Rizatriptan 10mg  with Zofran if needed 3.  Limit use of pain relievers to no more than 2 days out of week to prevent risk of rebound or medication-overuse headache. 4.  Keep headache diary 5.  Follow up in 6 months.   Subjective:  Erin [name] is a 35 year old right-handed female with asthma and migraines who presents for headache.  History supplemented by ED note.  Accompanied by her mother  Headache 1: Onset:  72 years old Location:  Frontal, right or left sided Quality:  pressure Intensity:  severe.  She denies new headache, thunderclap headache  Aura:  none Prodrome:  none Associated symptoms:  Nausea, photophobia, phonophobia and sometimes vomiting.  She denies associated unilateral numbness or weakness. Duration:  1 day Frequency:  Once a month (not associated with menstruation).  Occurs if headache does not respond to OTC analgesics Triggers:  unknown Relieving factors:  Turning off the light, laying down in bed Activity:  Aggravates  Headache 2: Onset:  Around late 20s Quality:  Pressure Intensity:  Moderate Location:  Behind both ears Duration:  6 hours Frequency: at least once a week Associated symptoms:  None   Frequency of analgesics:  Usually 1 day a week  Current NSAIDS/analgesics:  Tylenol Current triptans:  none Current ergotamine:  none Current anti-emetic:  none Current muscle relaxants:  none Current Antihypertensive medications:  none Current Antidepressant medications:  none Current Anticonvulsant medications:  none Current anti-CGRP:  none Current  Vitamins/Herbal/Supplements:  none Current Antihistamines/Decongestants:  none Other therapy:  none Hormone/birth control:  none   Past NSAIDS/analgesics:  Advil, Aleve, Excedrin Migraine Past abortive triptans:  none Past abortive ergotamine:  none Past muscle relaxants:  none Past anti-emetic:  none Past antihypertensive medications:  none Past antidepressant medications:  none Past anticonvulsant medications:  none Past anti-CGRP:  none Past vitamins/Herbal/Supplements:  none Past antihistamines/decongestants:  none Other past therapies:  none  Caffeine:  Just started drinking coffee 2 months ago, about 5 days a week Diet:  Drinks 1 L water daily.  Usually eats one meal a day Exercise:  no Depression:  No; Anxiety:  no Other pain:  no Sleep:  7 to 8 hours.  Goes to bed at 11 and wakes up at 7.  Feels rested during the day Family history of headache:  No      PAST MEDICAL HISTORY: Past Medical History:  Diagnosis Date  . 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 Asthma   . Migraine     PAST SURGICAL HISTORY: Past Surgical History:  Procedure Laterality Date  . COLPOSCOPY  2011   negative Bx  . DENTAL RESTORATION/EXTRACTION WITH X-RAY      MEDICATIONS: Current Outpatient Medications on File Prior to Visit  Medication Sig Dispense Refill  . hydrocortisone (ANUSOL-HC) 25 MG suppository Place 1 suppository (25 mg total) rectally 2 (two) times daily. 12 suppository 0  . ondansetron (ZOFRAN ODT) 8 MG disintegrating tablet Take 1 tablet (8 mg total) by mouth every 8 (eight) hours as needed for nausea or vomiting. 20 tablet 0   No current facility-administered medications on file prior to visit.  ALLERGIES: No Known Allergies  FAMILY HISTORY: Family History  Problem Relation Age of Onset  . Hypertension Mother     Objective:  Blood pressure (!) 151/91, pulse 84, height 5\' 8"  (1.727 m), weight 236 lb 12.8 oz (107.4 kg), SpO2 99 %. General: No acute  distress.  Patient appears well-groomed.   Head:  Normocephalic/atraumatic Eyes:  fundi examined but not visualized Neck: supple, no paraspinal tenderness, full range of motion Back: No paraspinal tenderness Heart: regular rate and rhythm Lungs: Clear to auscultation bilaterally. Vascular: No carotid bruits. Neurological Exam: Mental status: alert and oriented to person, place, and time, recent and remote memory intact, fund of knowledge intact, attention and concentration intact, speech fluent and not dysarthric, language intact. Cranial nerves: CN I: not tested CN II: pupils equal, round and reactive to light, visual fields intact CN III, IV, VI:  full range of motion, no nystagmus, no ptosis CN V: facial sensation intact. CN VII: upper and lower face symmetric CN VIII: hearing intact CN IX, X: gag intact, uvula midline CN XI: sternocleidomastoid and trapezius muscles intact CN XII: tongue midline Bulk & Tone: normal, no fasciculations. Motor:  muscle strength 5/5 throughout Sensation:  Pinprick, temperature and vibratory sensation intact. Deep Tendon Reflexes:  2+ throughout,  toes downgoing.   Finger to nose testing:  Without dysmetria.   Heel to shin:  Without dysmetria.   Gait:  Normal station and stride.  Romberg negative.    Thank you for allowing me to take part in the care of this patient.  Metta Clines, DO

## 2020-12-01 ENCOUNTER — Encounter: Payer: Self-pay | Admitting: Neurology

## 2020-12-01 ENCOUNTER — Other Ambulatory Visit: Payer: Self-pay

## 2020-12-01 ENCOUNTER — Ambulatory Visit: Payer: 59 | Admitting: Neurology

## 2020-12-01 VITALS — BP 151/91 | HR 84 | Ht 68.0 in | Wt 236.8 lb

## 2020-12-01 DIAGNOSIS — G43009 Migraine without aura, not intractable, without status migrainosus: Secondary | ICD-10-CM | POA: Diagnosis not present

## 2020-12-01 MED ORDER — RIZATRIPTAN BENZOATE 10 MG PO TBDP: ORAL_TABLET | ORAL | 5 refills | Status: DC

## 2020-12-01 MED ORDER — ONDANSETRON 8 MG PO TBDP: 8.0000 mg | ORAL_TABLET | Freq: Three times a day (TID) | ORAL | 5 refills | Status: DC | PRN

## 2020-12-01 NOTE — Patient Instructions (Signed)
  1. Take rizatriptan 10mg  at earliest onset of headache.  May repeat dose once in 2 hours if needed.  Maximum 2 tablets in 24 hours. 2. Take ondansetron for nausea 3. Limit use of pain relievers to no more than 2 days out of the week.  These medications include acetaminophen, NSAIDs (ibuprofen/Advil/Motrin, naproxen/Aleve, triptans (Imitrex/sumatriptan), Excedrin, and narcotics.  This will help reduce risk of rebound headaches. 4. Be aware of common food triggers:  - Caffeine:  coffee, black tea, cola, Mt. Dew  - Chocolate  - Dairy:  aged cheeses (brie, blue, cheddar, gouda, [place], provolone, [place], Swiss, etc), chocolate milk, buttermilk, sour cream, limit eggs and yogurt  - Nuts, peanut butter  - Alcohol  - Cereals/grains:  FRESH breads (fresh bagels, sourdough, doughnuts), yeast productions  - Processed/canned/aged/cured meats (pre-packaged deli meats, hotdogs)  - MSG/glutamate:  soy sauce, flavor enhancer, pickled/preserved/marinated foods  - Sweeteners:  aspartame (Equal, Nutrasweet).  Sugar and Splenda are okay  - Vegetables:  legumes (lima beans, lentils, snow peas, fava beans, pinto peans, peas, garbanzo beans), sauerkraut, onions, olives, pickles  - Fruit:  avocados, bananas, citrus fruit (orange, lemon, grapefruit), mango  - Other:  Frozen meals, macaroni and cheese 5. Routine exercise 6. Stay adequately hydrated (aim for 64 oz water daily) 7. Keep headache diary 8. Maintain proper stress management 9. Maintain proper sleep hygiene 10. Do not skip meals 11. Consider supplements:  magnesium citrate 400mg  daily, riboflavin 400mg  daily, coenzyme Q10 100mg  three times daily.

## 2021-01-05 ENCOUNTER — Ambulatory Visit: Payer: No Typology Code available for payment source | Admitting: Neurology

## 2021-02-26 ENCOUNTER — Ambulatory Visit (HOSPITAL_COMMUNITY)
Admission: EM | Admit: 2021-02-26 | Discharge: 2021-02-26 | Disposition: A | Discharge status: Home / Self Care | Payer: 59 | Attending: Medical Oncology | Admitting: Medical Oncology

## 2021-02-26 ENCOUNTER — Ambulatory Visit (INDEPENDENT_AMBULATORY_CARE_PROVIDER_SITE_OTHER): Payer: 59

## 2021-02-26 ENCOUNTER — Other Ambulatory Visit: Payer: Self-pay

## 2021-02-26 ENCOUNTER — Encounter (HOSPITAL_COMMUNITY): Payer: Self-pay

## 2021-02-26 DIAGNOSIS — R109 Unspecified abdominal pain: Secondary | ICD-10-CM | POA: Diagnosis not present

## 2021-02-26 DIAGNOSIS — R1084 Generalized abdominal pain: Secondary | ICD-10-CM | POA: Diagnosis not present

## 2021-02-26 DIAGNOSIS — R112 Nausea with vomiting, unspecified: Secondary | ICD-10-CM | POA: Insufficient documentation

## 2021-02-26 LAB — POCT URINALYSIS DIPSTICK, ED / UC
Bilirubin Urine: NEGATIVE
Glucose, UA: NEGATIVE mg/dL
Ketones, ur: NEGATIVE mg/dL
Leukocytes,Ua: NEGATIVE
Nitrite: NEGATIVE
Protein, ur: NEGATIVE mg/dL
Specific Gravity, Urine: 1.02 (ref 1.005–1.030)
Urobilinogen, UA: 0.2 mg/dL (ref 0.0–1.0)
pH: 8.5 — ABNORMAL HIGH (ref 5.0–8.0)

## 2021-02-26 LAB — POC URINE PREG, ED: Preg Test, Ur: NEGATIVE

## 2021-02-26 NOTE — ED Provider Notes (Signed)
[facility] [facility]    CSN: [id number] Arrival date & time: 02/26/21  1058      History   Chief Complaint Chief Complaint  Patient presents with   Abdominal Pain   Back Pain    HPI Erin [name] is a 35 y.o. female.   HPI  Abdominal Pain: Pt reports that for the past 2-3 weeks she has had episodes of abdominal and back pain. She has had 3 episodes total with last episodes being yesterday. Occurs after eating. Feels like a sharp shooting pain from epigastric/right upper abdomen to back. Associated with bloating sensation, nausea and vomiting. She has tried tums for symptoms which helps some. No constipation, diarrhea, bloody vomiting or stools. She does report marijuana use but states that this only occurs 3-4 times per week and not on a daily basis.   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 Authorizing Provider  hydrocortisone (ANUSOL-HC) 25 MG suppository Place 1 suppository (25 mg total) rectally 2 (two) times daily. Patient not taking: Reported on 12/01/2020 04/18/17   Jacelyn Pi, Lilia Argue, MD  ondansetron (ZOFRAN ODT) 8 MG disintegrating tablet Take 1 tablet (8 mg total) by mouth every 8 (eight) hours as needed for nausea or vomiting.  12/01/20   Pieter Partridge, DO  rizatriptan (MAXALT-MLT) 10 MG disintegrating tablet Take 1 tablet earliest onset of headache.  May repeat in 2 hours if needed.  Maximum 2 tablets in 24 hours. 12/01/20   Pieter Partridge, DO    Family History Family History  Problem Relation Age of Onset   Hypertension Mother     Social History Social History   Tobacco Use   Smoking status: Never   Smokeless tobacco: Never  Vaping Use   Vaping Use: Never used  Substance Use Topics   Alcohol use: Yes    Comment: Rare   Drug use: Not Currently    Types: Marijuana    Comment: marijuana every other day     Allergies   Patient has no known allergies.   Review of Systems Review of Systems  As stated above in HPI Physical Exam Triage Vital Signs ED Triage Vitals  Enc Vitals Group     BP 02/26/21 1229 (!) 144/88     Pulse Rate 02/26/21 1228 84     Resp 02/26/21 1228 19     Temp 02/26/21 1228  98.3 F (36.8 C)     Temp Source 02/26/21 1228 Oral     SpO2 02/26/21 1228 100 %     Weight --      Height --      Head Circumference --      Peak Flow --      Pain Score 02/26/21 1227 0     Pain Loc --      Pain Edu? --      Excl. in [place]? --    No data found.  Updated Vital Signs BP (!) 144/88 (BP Location: Right Arm)   Pulse 84   Temp 98.3 F (36.8 C) (Oral)   Resp 19   LMP 02/01/2021 (Exact Date)   SpO2 100%   Physical Exam Vitals and nursing note reviewed.  Constitutional:      General: She is not in acute distress.    Appearance: She is well-developed. She is not ill-appearing, toxic-appearing or diaphoretic.  HENT:     Head: Normocephalic and atraumatic.     Mouth/Throat:     Mouth: Mucous membranes are moist.  Eyes:     Extraocular Movements: Extraocular movements intact.     Pupils: Pupils are equal, round, and reactive to light.  Cardiovascular:     Rate and Rhythm: Normal rate and regular rhythm.     Heart sounds: Normal heart sounds.  Pulmonary:     Effort: Pulmonary  effort is normal.     Breath sounds: Normal breath sounds.  Abdominal:     General: Abdomen is flat. Bowel sounds are normal.     Palpations: Abdomen is soft. There is no hepatomegaly, splenomegaly or mass.     Tenderness: There is no abdominal tenderness. There is no right CVA tenderness, left CVA tenderness, guarding or rebound. Negative signs include Murphy's sign and McBurney's sign.     Hernia: No hernia is present.  Skin:    General: Skin is warm.     Coloration: Skin is not jaundiced.  Neurological:     Mental Status: She is alert and oriented to person, place, and time.     UC Treatments / Results  Labs (all labs ordered are listed, but only abnormal results are displayed) Labs Reviewed  POCT URINALYSIS DIPSTICK, ED / UC - Abnormal; Notable for the following components:      Result Value   Hgb urine dipstick TRACE (*)    pH 8.5 (*)    All other components within normal limits  URINE CULTURE  POC URINE PREG, ED    EKG   Radiology No results found.  Procedures Procedures (including critical care time)  Medications Ordered in UC Medications - No data to display  Initial Impression / Assessment and Plan / UC Course  I have reviewed the triage vital signs and the nursing notes.  Pertinent labs & imaging results that were available during my care of the patient were reviewed by me and considered in my medical decision making (see chart for details).     New. UA non-suggestive of UTI.  More likely to be related to gallbladder.  Obtaining a KUB.  Discussed low to no fat diet, red flag signs and symptoms and nausea medicine management.  She states that she has nausea medicine at home.  Final Clinical Impressions(s) / UC Diagnoses   Final diagnoses:  None   Discharge Instructions   None    ED Prescriptions   None    PDMP not reviewed this encounter.  Hughie Closs, [place] 02/26/21 1331

## 2021-02-26 NOTE — ED Triage Notes (Signed)
Pt presents with abdominal and back pain.   States she felt she had gas and states she has been vomiting x 3 weeks.

## 2021-02-27 LAB — URINE CULTURE

## 2021-06-07 NOTE — Progress Notes (Deleted)
NEUROLOGY FOLLOW UP OFFICE NOTE  Nkechi Linehan [id number]  Assessment/Plan:   Migraine without aura, without status migrainosus, not intractable  Migraine prevention:  *** Migraine rescue:  *** Limit use of pain relievers to no more than 2 days out of week to prevent risk of rebound or medication-overuse headache. Keep headache diary Follow up ***   Subjective:  KILEA MCCAREY is a 35 year old right-handed female with asthma and migraines who follows up for migraines  Accompanied by her mother   UPDATE: Intensity:  *** Duration:  *** Frequency:  *** Current NSAIDS/analgesics:  Tylenol Current triptans:  rizatriptan 10mg  Current ergotamine:  none Current anti-emetic:  Zofran 4mg  Current muscle relaxants:  none Current Antihypertensive medications:  none Current Antidepressant medications:  none Current Anticonvulsant medications:  none Current anti-CGRP:  none Current Vitamins/Herbal/Supplements:  none Current Antihistamines/Decongestants:  none Other therapy:  none Hormone/birth control:  none  Caffeine:  Just started drinking coffee 2 months ago, about 5 days a week Diet:  Drinks 1 L water daily.  Usually eats one meal a day Exercise:  no Depression:  No; Anxiety:  no Other pain:  no Sleep:  7 to 8 hours.  Goes to bed at 11 and wakes up at 7.  Feels rested during the day  HISTORY: Headache 1 Onset:  35 years old Location:  Frontal, right or left sided Quality:  pressure Initial Intensity:  severe.  She denies new headache, thunderclap headache  Aura:  none Prodrome:  none Associated symptoms:  Nausea, photophobia, phonophobia and sometimes vomiting.  She denies associated unilateral numbness or weakness. Initial Duration:  1 day Initial Frequency:  Once a month (not associated with menstruation).  Occurs if headache does not respond to OTC analgesics Triggers:  unknown Relieving factors:  Turning off the light, laying down in bed Activity:   Aggravates   Headache 2: Onset:  Around late 20s Quality:  Pressure Intensity:  Moderate Location:  Behind both ears Duration:  6 hours Frequency: at least once a week Associated symptoms:  None   Past NSAIDS/analgesics:  Advil, Aleve, Excedrin Migraine Past abortive triptans:  none Past abortive ergotamine:  none Past muscle relaxants:  none Past anti-emetic:  none Past antihypertensive medications:  none Past antidepressant medications:  none Past anticonvulsant medications:  none Past anti-CGRP:  none Past vitamins/Herbal/Supplements:  none Past antihistamines/decongestants:  none Other past therapies:  none    Family history of headache:  No  PAST MEDICAL HISTORY: 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 20 tablet 5   rizatriptan (MAXALT-MLT) 10 MG disintegrating tablet Take 1 tablet earliest onset of headache.  May repeat in 2 hours if needed.  Maximum 2 tablets in 24 hours. 10 tablet 5   No current facility-administered medications on file prior to visit.    ALLERGIES: No Known Allergies  FAMILY HISTORY: Family History  Problem Relation Age of Onset   Hypertension Mother       Objective:  *** General: No acute distress.  Patient appears ***-groomed.   Head:  Normocephalic/atraumatic Eyes:  Fundi examined but not visualized Neck: supple, no paraspinal tenderness, full range of  motion Heart:  Regular rate and rhythm Lungs:  Clear to auscultation bilaterally Back: No paraspinal tenderness Neurological Exam: alert and oriented to person, place, and time.  Speech fluent and not dysarthric, language intact.  CN II-XII intact. Bulk and tone normal,  muscle strength 5/5 throughout.  Sensation to light touch intact.  Deep tendon reflexes 2+ throughout, toes downgoing.  Finger to nose testing intact.  Gait normal, Romberg negative.   Metta Clines, DO  CC: ***

## 2021-06-08 ENCOUNTER — Ambulatory Visit: Payer: 59 | Admitting: Neurology

## 2021-07-01 ENCOUNTER — Encounter: Payer: Self-pay | Admitting: Nurse Practitioner

## 2021-07-01 ENCOUNTER — Ambulatory Visit (INDEPENDENT_AMBULATORY_CARE_PROVIDER_SITE_OTHER): Payer: 59 | Admitting: Nurse Practitioner

## 2021-07-01 ENCOUNTER — Other Ambulatory Visit: Payer: Self-pay

## 2021-07-01 VITALS — BP 138/70 | HR 80 | Temp 97.4°F | Ht 67.25 in | Wt 212.6 lb

## 2021-07-01 DIAGNOSIS — R1013 Epigastric pain: Secondary | ICD-10-CM | POA: Diagnosis not present

## 2021-07-01 DIAGNOSIS — Z8249 Family history of ischemic heart disease and other diseases of the circulatory system: Secondary | ICD-10-CM | POA: Insufficient documentation

## 2021-07-01 DIAGNOSIS — R8290 Unspecified abnormal findings in urine: Secondary | ICD-10-CM | POA: Diagnosis not present

## 2021-07-01 MED ORDER — OMEPRAZOLE 20 MG PO CPDR: 20.0000 mg | DELAYED_RELEASE_CAPSULE | Freq: Two times a day (BID) | ORAL | 0 refills | Status: DC

## 2021-07-01 NOTE — Progress Notes (Signed)
Subjective:  Patient ID: Erin [name], female    DOB: 1985-07-28  Age: 35 y.o. MRN: [id number]  CC: Establish Care (New patient/Pt states she has been having issues with her stomach and thinks it may be her gallbladder. Pt states there is pain in her low back and then it feels like a ball in her abdomen and the pain at times causes her to vomit. )  Abdominal Pain This is a new problem. The current episode started more than 1 month ago (onset in 02/2021). The onset quality is gradual. The problem occurs intermittently (occurs 2-3x/month). The problem has been waxing and waning. The pain is located in the periumbilical region. The quality of the pain is colicky and cramping. The abdominal pain radiates to the back. Associated symptoms include anorexia, nausea, vomiting and weight loss. Pertinent negatives include no arthralgias, belching, constipation, diarrhea, dysuria, fever, flatus, frequency, headaches, hematochezia, hematuria, melena or myalgias. Associated symptoms comments: Weight loss due to decrease nutrition. The pain is aggravated by eating (skipping meals). The pain is relieved by Nothing. She has tried nothing for the symptoms. Her past medical history is significant for GERD. There is no history of abdominal surgery, colon cancer, Crohn's disease, gallstones, irritable bowel syndrome, pancreatitis, PUD or ulcerative colitis.  No tobacco use Marijuana [place] 2-3x/week (1-2joints) Eats 1-60meals per day, skips lunch Drinks 1cup on coffee per day.  Reviewed ED notes, radiology and lab results. Wt Readings from Last 3 Encounters:  07/01/21 212 lb 9.6 oz (96.4 kg)  12/01/20 236 lb 12.8 oz (107.4 kg)  04/18/17 226 lb (102.5 kg)    Reviewed past Medical, Social and Family history today.  Outpatient Medications Prior to Visit  Medication Sig Dispense Refill   ondansetron (ZOFRAN ODT) 8 MG disintegrating tablet Take 1 tablet (8 mg total) by mouth every 8 (eight) hours as needed for  nausea or vomiting. 20 tablet 5   rizatriptan (MAXALT-MLT) 10 MG disintegrating tablet Take 1 tablet earliest onset of headache.  May repeat in 2 hours if needed.  Maximum 2 tablets in 24 hours. 10 tablet 5   No facility-administered medications prior to visit.    ROS See HPI  Objective:  BP 138/70 (BP Location: Right Arm, Patient Position: Sitting, Cuff Size: Large)    Pulse 80    Temp (!) 97.4 F (36.3 C) (Temporal)    Ht 5' 7.25" (1.708 m)    Wt 212 lb 9.6 oz (96.4 kg)    BMI 33.05 kg/m   Physical Exam Cardiovascular:     Rate and Rhythm: Normal rate.     Pulses: Normal pulses.  Pulmonary:     Effort: Pulmonary effort is normal.  Abdominal:     General: Bowel sounds are normal. There is no distension.     Palpations: Abdomen is soft. There is no mass.     Tenderness: There is no abdominal tenderness. There is no right CVA tenderness, left CVA tenderness or guarding.     Hernia: No hernia is present.  Skin:    Findings: No rash.  Neurological:     Mental Status: She is alert and oriented to person, place, and time.  Psychiatric:        Mood and Affect: Mood normal.        Behavior: Behavior normal.        Thought Content: Thought content normal.    Assessment & Plan:  This visit occurred during the SARS-CoV-2 public health emergency.  Safety protocols were in  place, including screening questions prior to the visit, additional usage of staff PPE, and extensive cleaning of exam room while observing appropriate contact time as indicated for disinfecting solutions.   Kendalynn was seen today for establish care.  Diagnoses and all orders for this visit:  Dyspepsia -     H. pylori breath test -     omeprazole (PRILOSEC) 20 MG capsule; Take 1 capsule (20 mg total) by mouth 2 (two) times daily before a meal.  Abnormal urinalysis -     Urinalysis w microscopic + reflex cultur   Problem List Items Addressed This Visit   None Visit Diagnoses     Dyspepsia    -  Primary    Relevant Medications   omeprazole (PRILOSEC) 20 MG capsule   Other Relevant Orders   H. pylori breath test   Abnormal urinalysis       Relevant Orders   Urinalysis w microscopic + reflex cultur       Follow-up: Return in about 4 weeks (around 07/29/2021) for CPE (fasting).  Wilfred Lacy, NP

## 2021-07-01 NOTE — Patient Instructions (Signed)
Go to lab for H pylori test Stop coffee and marijuana Start omeprazole as prescribed Maintain Brat diet x 1week, then advance diet as tolerated. Indigestion Indigestion is a feeling of pain, discomfort, burning, or fullness in the upper part of your abdomen. It can come and go. It may occur frequently or rarely. Indigestion tends to occur while you are eating or right after you have finished eating. It may be worse at night and while bending over or lying down. Indigestion may be a symptom of an underlying digestive condition. Follow these instructions at home: Eating and drinking  Follow an eating plan as recommended by your health care provider. Avoid certain foods and drinks as told by your health care provider. This may include: Chocolate and cocoa. Peppermint and mint flavorings. Garlic and onions. Horseradish. Spicy and acidic foods, including peppers, chili powder, curry powder, vinegar, hot sauces, and barbecue sauce. Citrus fruits, such as oranges, lemons, and limes. Tomato-based foods, such as red sauce, chili, salsa, and pizza with red sauce. Fried and fatty foods, such as donuts, french fries, potato chips, and high-fat dressings. High-fat meats, such as hot dogs and fatty cuts of red and white meats, such as rib eye steak, sausage, ham, and bacon. High-fat dairy items, such as whole milk, butter, and cream cheese. Coffee and tea, with or without caffeine. Drinks that contain alcohol. Energy drinks and sports drinks. Carbonated drinks or sodas. Citrus fruit juices. Eat small, frequent meals instead of large meals. Avoid drinking large amounts of liquid with your meals. Avoid eating meals during the 2-3 hours before bedtime. Avoid lying down right after you eat. Avoid exercise for 2 hours after you eat. Lifestyle   Maintain a healthy weight. Ask your health care provider what weight is healthy for you. If you need to lose weight, work with your health care provider to do  so safely. Exercise for at least 30 minutes on 5 or more days each week, or as told by your health care provider. Avoid exercises that include bending forward. This can make your symptoms worse. Wear loose-fitting clothing. Do not wear anything tight around your waist that causes pressure on your abdomen. Do not use any products that contain nicotine or tobacco. These products include cigarettes, chewing tobacco, and vaping devices, such as e-cigarettes. These can make symptoms worse. If you need help quitting, ask your health care provider. Raise (elevate) the head of your bed about 6 inches (15 cm) when you sleep. You can use a wedge to do this. Try to reduce your stress, such as with yoga or meditation. If you need help reducing stress, ask your health care provider. General instructions Take over-the-counter and prescription medicines only as told by your health care provider. Do not take aspirin or NSAIDs, such as ibuprofen, unless your health care provider told you to take them. Pay attention to any changes in your symptoms. Keep all follow-up visits. This is important. Contact a health care provider if: You have new symptoms. You have unexplained weight loss. You have difficulty swallowing, or it hurts to swallow. Your symptoms do not improve with treatment. Your symptoms last for more than 2 days. You have a fever. You vomit. Get help right away if: You suddenly have pain in your arms, neck, jaw, teeth, or back. You suddenly feel sweaty, dizzy, or light-headed. You faint. You have chest pain or shortness of breath. You cannot stop vomiting, or you vomit blood. Your stool is bloody or black. You have severe pain  in your abdomen. These symptoms may represent a serious problem that is an emergency. Do not wait to see if the symptoms will go away. Get medical help right away. Call your local emergency services (911 in the [place] Do not drive yourself to the  hospital. Summary Indigestion is a feeling of pain, discomfort, burning, or fullness in the upper part of your abdomen. It tends to occur while you are eating or right after you have finished eating. Follow an eating plan and other lifestyle changes as told by your health care provider. Take over-the-counter and prescription medicines only as told by your health care provider. Do not take aspirin or NSAIDs, such as ibuprofen, unless your health care provider told you to do so. Contact your health care provider if your symptoms do not get better or they get worse. Some symptoms may represent a serious problem that is an emergency. Do not wait to see if the symptoms will go away. Get medical help right away. This information is not intended to replace advice given to you by your health care provider. Make sure you discuss any questions you have with your health care provider. Document Revised: 01/01/2020 Document Reviewed: 01/01/2020 Criss Rosales Diet A bland diet consists of foods that are often soft and do not have a lot of fat, fiber, or extra seasonings. Foods without fat, fiber, or seasoning are easier for the body to digest. They are also less likely to irritate your mouth, throat, stomach, and other parts of your digestive system. A bland diet is sometimes called a BRAT diet. What is my plan? Your health care provider or food and nutrition specialist (dietitian) may recommend specific changes to your diet to prevent symptoms or to treat your symptoms. These changes may include: Eating small meals often. Cooking food until it is soft enough to chew easily. Chewing your food well. Drinking fluids slowly. Not eating foods that are very spicy, sour, or fatty. Not eating citrus fruits, such as oranges and grapefruit. What do I need to know about this diet? Eat a variety of foods from the bland diet food list. Do not follow a bland diet longer than needed. Ask your health care provider whether you  should take vitamins or supplements. What foods can I eat? Grains Hot cereals, such as cream of wheat. Rice. Bread, crackers, or tortillas made from refined white flour. Vegetables Canned or cooked vegetables. Mashed or boiled potatoes. Fruits Bananas. Applesauce. Other types of cooked or canned fruit with the skin and seeds removed, such as canned peaches or pears. Meats and other proteins Scrambled eggs. Creamy peanut butter or other nut butters. Lean, well-cooked meats, such as chicken or fish. Tofu. Soups or broths. Dairy Low-fat dairy products, such as milk, cottage cheese, or yogurt. Beverages Water. Herbal tea. Apple juice. Fats and oils Mild salad dressings. Canola or olive oil. Sweets and desserts Pudding. Custard. Fruit gelatin. Ice cream. The items listed above may not be a complete list of recommended foods and beverages. Contact a dietitian for more options. What foods are not recommended? Grains Whole grain breads and cereals. Vegetables Raw vegetables. Fruits Raw fruits, especially citrus, berries, or dried fruits. Dairy Whole fat dairy foods. Beverages Caffeinated drinks. Alcohol. Seasonings and condiments Strongly flavored seasonings or condiments. Hot sauce. Salsa. Other foods Spicy foods. Fried foods. Sour foods, such as pickled or fermented foods. Foods with high sugar content. Foods high in fiber. The items listed above may not be a complete list of foods and beverages  to avoid. Contact a dietitian for more information. Summary A bland diet consists of foods that are often soft and do not have a lot of fat, fiber, or extra seasonings. Foods without fat, fiber, or seasoning are easier for the body to digest. Check with your health care provider to see how long you should follow this diet plan. It is not meant to be followed for long periods. This information is not intended to replace advice given to you by your health care provider. Make sure you discuss  any questions you have with your health care provider. Document Revised: 07/26/2017 Document Reviewed: 07/26/2017 Elsevier Patient Education  2022 [place] Patient Education  2022 [employer].

## 2021-07-03 LAB — URINALYSIS W MICROSCOPIC + REFLEX CULTURE
Bacteria, UA: NONE SEEN /HPF
Bilirubin Urine: NEGATIVE
Glucose, UA: NEGATIVE
Hgb urine dipstick: NEGATIVE
Hyaline Cast: NONE SEEN /LPF
Nitrites, Initial: NEGATIVE
Protein, ur: NEGATIVE
Specific Gravity, Urine: 1.017 (ref 1.001–1.035)
pH: 6 (ref 5.0–8.0)

## 2021-07-03 LAB — URINE CULTURE
MICRO NUMBER:: 12794397
SPECIMEN QUALITY:: ADEQUATE

## 2021-07-03 LAB — CULTURE INDICATED

## 2021-07-03 LAB — H. PYLORI BREATH TEST: H. pylori Breath Test: NOT DETECTED

## 2021-07-07 ENCOUNTER — Ambulatory Visit: Payer: 59 | Admitting: Nurse Practitioner

## 2021-07-08 ENCOUNTER — Telehealth: Payer: Self-pay | Admitting: Nurse Practitioner

## 2021-07-09 NOTE — Telephone Encounter (Signed)
Results have been given to patient 

## 2021-08-06 ENCOUNTER — Telehealth: Payer: Self-pay | Admitting: Nurse Practitioner

## 2021-08-06 ENCOUNTER — Encounter: Payer: 59 | Admitting: Nurse Practitioner

## 2021-08-06 NOTE — Telephone Encounter (Signed)
Pt did not show up for her appointment. 08/06/21

## 2021-08-22 NOTE — Telephone Encounter (Signed)
1st no show. Letter sent. Fee waived. No future appt scheduled.

## 2022-01-12 NOTE — Progress Notes (Unsigned)
NEUROLOGY FOLLOW UP OFFICE NOTE  Erin [name] [id number]  Assessment/Plan:   Migraine without aura, without status migrainosus, not intractable   1.  Migraine prevention:  Not indicated. 2.  Migraine rescue:  Rizatriptan '10mg'$  with Zofran if needed 3.  Limit use of pain relievers to no more than 2 days out of week to prevent risk of rebound or medication-overuse headache. 4.  Keep headache diary 5.  Follow up in 1 year.     Subjective:  Erin [name] is a 36 year old right-handed female with asthma and migraines who follows up for migraines.  Accompanied by her mother  UPDATE: Intensity:  moderate Duration:  30 to 60 minutes with rizatriptan. Frequency:  No migraines in June.  This year, she has had about 3 Current NSAIDS/analgesics:  Tylenol Current triptans:  rizatriptan '10mg'$  Current ergotamine:  none Current anti-emetic:  zofran '4mg'$  Current muscle relaxants:  none Current Antihypertensive medications:  none Current Antidepressant medications:  none Current Anticonvulsant medications:  none Current anti-CGRP:  none Current Vitamins/Herbal/Supplements:  none Current Antihistamines/Decongestants:  none Other therapy:  none Hormone/birth control:  none  Caffeine:  Cut back on caffeine Diet:  Drinks 1 L water daily.  No longer skipping meals.  Cut back on alcohol Exercise:  walks 10-15 minutes twice a week.  Says she needs to improve.   Depression:  No; Anxiety:  no Other pain:  no Sleep:  7 to 8 hours.  Goes to bed at 11 and wakes up at 7.  Feels rested during the day  HISTORY  Headache 1: Onset:  36 years old Location:  Frontal, right or left sided Quality:  pressure Intensity:  severe.  She denies new headache, thunderclap headache  Aura:  none Prodrome:  none Associated symptoms:  Nausea, photophobia, phonophobia and sometimes vomiting.  She denies associated unilateral numbness or weakness. Duration:  1 day Frequency:  Once a month (not associated  with menstruation).  Occurs if headache does not respond to OTC analgesics Triggers:  unknown Relieving factors:  Turning off the light, laying down in bed Activity:  Aggravates   Headache 2: Onset:  Around late 20s Quality:  Pressure Intensity:  Moderate Location:  Behind both ears Duration:  6 hours Frequency: at least once a week Associated symptoms:  None       Past NSAIDS/analgesics:  Advil, Aleve, Excedrin Migraine Past abortive triptans:  none Past abortive ergotamine:  none Past muscle relaxants:  none Past anti-emetic:  none Past antihypertensive medications:  none Past antidepressant medications:  none Past anticonvulsant medications:  none Past anti-CGRP:  none Past vitamins/Herbal/Supplements:  none Past antihistamines/decongestants:  none Other past therapies:  none    Family history of headache:  No  PAST MEDICAL HISTORY: 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 30 capsule 0   ondansetron (ZOFRAN ODT) 8 MG disintegrating tablet Take 1 tablet (8 mg total) by mouth every 8 (eight) hours as needed for nausea or vomiting. 20 tablet 5   rizatriptan (MAXALT-MLT) 10 MG disintegrating tablet Take 1 tablet earliest onset of headache.  May repeat in 2 hours if needed.  Maximum 2 tablets in 24 hours. 10 tablet  5   No current facility-administered medications on file prior to visit.    ALLERGIES: No Known Allergies  FAMILY HISTORY: Family History  Problem Relation Age of Onset   Hypertension Mother       Objective:  Blood pressure (!) 144/89, pulse 66, height '5\' 8"'$  (1.727 m), weight 221 lb (100.2 kg), SpO2 99 %. General: No acute distress.  Patient appears well-groomed.   Head:   Normocephalic/atraumatic Eyes:  Fundi examined but not visualized Neck: supple, no paraspinal tenderness, full range of motion Heart:  Regular rate and rhythm Neurological Exam: alert and oriented to person, place, and time.  Speech fluent and not dysarthric, language intact.  CN II-XII intact. Bulk and tone normal, muscle strength 5/5 throughout.  Sensation to light touch intact.  Deep tendon reflexes 2+ throughout, toes downgoing.  Finger to nose testing intact.  Gait normal, Romberg negative.   Erin Clines, DO  CC: Erin Buffy, NP

## 2022-01-13 ENCOUNTER — Ambulatory Visit: Payer: No Typology Code available for payment source | Admitting: Neurology

## 2022-01-13 ENCOUNTER — Encounter: Payer: Self-pay | Admitting: Neurology

## 2022-01-13 VITALS — BP 144/89 | HR 66 | Ht 68.0 in | Wt 221.0 lb

## 2022-01-13 DIAGNOSIS — G43009 Migraine without aura, not intractable, without status migrainosus: Secondary | ICD-10-CM

## 2022-01-13 MED ORDER — RIZATRIPTAN BENZOATE 10 MG PO TBDP: ORAL_TABLET | ORAL | 11 refills | Status: AC

## 2022-04-14 ENCOUNTER — Encounter (HOSPITAL_COMMUNITY): Payer: Self-pay

## 2022-04-14 ENCOUNTER — Other Ambulatory Visit: Payer: Self-pay

## 2022-04-14 ENCOUNTER — Emergency Department (HOSPITAL_COMMUNITY)
Admission: EM | Admit: 2022-04-14 | Discharge: 2022-04-15 | Disposition: A | Discharge status: Home / Self Care | Payer: No Typology Code available for payment source | Attending: Emergency Medicine | Admitting: Emergency Medicine

## 2022-04-14 DIAGNOSIS — N9489 Other specified conditions associated with female genital organs and menstrual cycle: Secondary | ICD-10-CM | POA: Diagnosis not present

## 2022-04-14 DIAGNOSIS — R1032 Left lower quadrant pain: Secondary | ICD-10-CM | POA: Diagnosis not present

## 2022-04-14 DIAGNOSIS — R1030 Lower abdominal pain, unspecified: Secondary | ICD-10-CM | POA: Insufficient documentation

## 2022-04-14 DIAGNOSIS — R112 Nausea with vomiting, unspecified: Secondary | ICD-10-CM | POA: Diagnosis not present

## 2022-04-14 LAB — URINALYSIS, ROUTINE W REFLEX MICROSCOPIC
Bilirubin Urine: NEGATIVE
Glucose, UA: NEGATIVE mg/dL
Ketones, ur: 80 mg/dL — AB
Leukocytes,Ua: NEGATIVE
Nitrite: NEGATIVE
Protein, ur: 100 mg/dL — AB
RBC / HPF: >50 RBC/hpf — ABNORMAL HIGH (ref 0–5)
Specific Gravity, Urine: 1.023 (ref 1.005–1.030)
pH: 7 (ref 5.0–8.0)

## 2022-04-14 LAB — COMPREHENSIVE METABOLIC PANEL
ALT: 17 U/L (ref 0–44)
AST: 19 U/L (ref 15–41)
Albumin: 4.3 g/dL (ref 3.5–5.0)
Alkaline Phosphatase: 48 U/L (ref 38–126)
Anion gap: 11 (ref 5–15)
BUN: 5 mg/dL — ABNORMAL LOW (ref 6–20)
CO2: 23 mmol/L (ref 22–32)
Calcium: 9.3 mg/dL (ref 8.9–10.3)
Chloride: 105 mmol/L (ref 98–111)
Creatinine, Ser: 0.83 mg/dL (ref 0.44–1.00)
GFR, Estimated: >60 mL/min (ref >60)
Glucose, Bld: 131 mg/dL — ABNORMAL HIGH (ref 70–99)
Potassium: 4.1 mmol/L (ref 3.5–5.1)
Sodium: 139 mmol/L (ref 135–145)
Total Bilirubin: 0.6 mg/dL (ref 0.3–1.2)
Total Protein: 7.4 g/dL (ref 6.5–8.1)

## 2022-04-14 LAB — CBC
HCT: 36.1 % (ref 36.0–46.0)
Hemoglobin: 12.7 g/dL (ref 12.0–15.0)
MCH: 31.8 pg (ref 26.0–34.0)
MCHC: 35.2 g/dL (ref 30.0–36.0)
MCV: 90.5 fL (ref 80.0–100.0)
Platelets: 326 10*3/uL (ref 150–400)
RBC: 3.99 MIL/uL (ref 3.87–5.11)
RDW: 11.3 % — ABNORMAL LOW (ref 11.5–15.5)
WBC: 8.7 10*3/uL (ref 4.0–10.5)
nRBC: 0 % (ref 0.0–0.2)

## 2022-04-14 LAB — I-STAT BETA HCG BLOOD, ED (MC, WL, AP ONLY): I-stat hCG, quantitative: <5 m[IU]/mL (ref <5)

## 2022-04-14 LAB — LIPASE, BLOOD: Lipase: 21 U/L (ref 11–51)

## 2022-04-14 MED ORDER — ONDANSETRON 4 MG PO TBDP
4.0000 mg | ORAL_TABLET | Freq: Once | ORAL | Status: AC | PRN
  Administered 2022-04-14: 4 mg via ORAL
  Filled 2022-04-14: qty 1

## 2022-04-14 NOTE — ED Provider Triage Note (Signed)
Emergency Medicine Provider Triage Evaluation Note  Erin [name] , a 36 y.o. female  was evaluated in triage.  Pt complains of lower abdominal pain for 2 days.  Patient states that she is currently on her menstrual period, has never had pain like this before.  Patient also endorsing bilateral back pain.  The patient denies any dysuria however does endorse nausea and vomiting.  Patient denies any diarrhea, fevers.  Patient denies any vaginal discharge.  Review of Systems  Positive:  Negative:   Physical Exam  BP (!) 166/90   Pulse 77   Temp 99.1 F (37.3 C) (Oral)   Resp 15   Ht '5\' 8"'$  (1.727 m)   Wt 100.2 kg   SpO2 95%   BMI 33.60 kg/m  Gen:   Awake, no distress   Resp:  Normal effort  MSK:   Moves extremities without difficulty  Other:    Medical Decision Making  Medically screening exam initiated at 6:42 PM.  Appropriate orders placed.  Erin [name] was informed that the remainder of the evaluation will be completed by another provider, this initial triage assessment does not replace that evaluation, and the importance of remaining in the ED until their evaluation is complete.     Erin Cecil, PA-C 04/14/22 1843

## 2022-04-14 NOTE — ED Triage Notes (Signed)
Patient reports lower abd pain x 2 days with vomiting.  Denies diarrhea.  Denies dsyuria.  Patient currently on period

## 2022-04-15 ENCOUNTER — Telehealth: Payer: Self-pay

## 2022-04-15 MED ORDER — ONDANSETRON 4 MG PO TBDP
4.0000 mg | ORAL_TABLET | Freq: Once | ORAL | Status: AC
  Administered 2022-04-15: 4 mg via ORAL
  Filled 2022-04-15: qty 1

## 2022-04-15 MED ORDER — ONDANSETRON HCL 4 MG PO TABS: 4.0000 mg | ORAL_TABLET | Freq: Four times a day (QID) | ORAL | 0 refills | Status: DC

## 2022-04-15 MED ORDER — DICYCLOMINE HCL 20 MG PO TABS: 20.0000 mg | ORAL_TABLET | Freq: Two times a day (BID) | ORAL | 0 refills | Status: DC

## 2022-04-15 MED ORDER — DICYCLOMINE HCL 10 MG PO CAPS
10.0000 mg | ORAL_CAPSULE | Freq: Once | ORAL | Status: AC
  Administered 2022-04-15: 10 mg via ORAL
  Filled 2022-04-15: qty 1

## 2022-04-15 NOTE — ED Provider Notes (Signed)
[facility] EMERGENCY DEPARTMENT Provider Note   CSN: [id number] Arrival date & time: 04/14/22  1643     History  Chief Complaint  Patient presents with   Abdominal Pain    Erin [name] is a 36 y.o. female with no significant past medical history presenting to the emergency department for evaluation of abdominal pain.  Patient states he has had abdominal pain since having a biscuit at [employer] 2 days ago.  The pain is constant and located in the lower abdomen radiating to her back.  States she has had bilious vomitus. Denies constipation, diarrhea.  Patient states that in the last year about once a month she usually have abdominal pain 4 hours after eating.  This time the pain, nausea, vomiting are constant.  She has tried Tums with no relief. States she feels better after having a bowel movement. She states she has recently got a job promotion and has been having more stress.   Abdominal Pain      Home Medications Prior to Admission medications   Medication Sig Start Date End Date Taking? Authorizing Provider  omeprazole (PRILOSEC) 20 MG capsule Take 1 capsule (20 mg total) by mouth 2 (two) times daily before a meal. Patient not taking: Reported on 01/13/2022 07/01/21   Nche, Charlene Brooke, NP  ondansetron (ZOFRAN ODT) 8 MG disintegrating tablet Take 1 tablet (8 mg total) by mouth every 8 (eight) hours as needed for nausea or vomiting. Patient not taking: Reported on 01/13/2022 12/01/20   Pieter Partridge, DO  rizatriptan (MAXALT-MLT) 10 MG disintegrating tablet Take 1 tablet earliest onset of headache.  May repeat in 2 hours if needed.  Maximum 2 tablets in 24 hours. 01/13/22   Pieter Partridge, DO      Allergies    Patient has no known allergies.    Review of Systems   Review of Systems  Gastrointestinal:  Positive for abdominal pain.    Physical Exam Updated Vital Signs BP (!) 150/84 (BP Location: Left Arm)   Pulse 81   Temp 98.3 F (36.8 C)   Resp  16   Ht '5\' 8"'$  (1.727 m)   Wt 100.2 kg   SpO2 100%   BMI 33.60 kg/m  Physical Exam Vitals and nursing note reviewed.  Constitutional:      Appearance: Normal appearance.  HENT:     Head: Normocephalic and atraumatic.     Mouth/Throat:     Mouth: Mucous membranes are moist.  Eyes:     General: No scleral icterus. Cardiovascular:     Rate and Rhythm: Normal rate and regular rhythm.     Pulses: Normal pulses.     Heart sounds: Normal heart sounds.  Pulmonary:     Effort: Pulmonary effort is normal.     Breath sounds: Normal breath sounds.  Abdominal:     General: Abdomen is flat.     Palpations: Abdomen is soft.     Tenderness: There is no abdominal tenderness.  Musculoskeletal:        General: No deformity.  Skin:    General: Skin is warm.     Findings: No rash.  Neurological:     General: No focal deficit present.     Mental Status: She is alert.  Psychiatric:        Mood and Affect: Mood normal.     ED Results / Procedures / Treatments   Labs (all labs ordered are listed, but only abnormal results are displayed)  Labs Reviewed  COMPREHENSIVE METABOLIC PANEL - Abnormal; Notable for the following components:      Result Value   Glucose, Bld 131 (*)    BUN 5 (*)    All other components within normal limits  CBC - Abnormal; Notable for the following components:   RDW 11.3 (*)    All other components within normal limits  URINALYSIS, ROUTINE W REFLEX MICROSCOPIC - Abnormal; Notable for the following components:   APPearance HAZY (*)    Hgb urine dipstick LARGE (*)    Ketones, ur 80 (*)    Protein, ur 100 (*)    RBC / HPF >50 (*)    Bacteria, UA RARE (*)    All other components within normal limits  LIPASE, BLOOD  I-STAT BETA HCG BLOOD, ED ([facility], [facility], [facility] ONLY)    EKG None  Radiology No results found.  Procedures Procedures    Medications Ordered in ED Medications  ondansetron (ZOFRAN-ODT) disintegrating tablet 4 mg (4 mg Oral Given 04/14/22 1911)     ED Course/ Medical Decision Making/ A&P                           Medical Decision Making Amount and/or Complexity of Data Reviewed Labs: ordered.  Risk Prescription drug management.   This patient presents to the ED for concern of lower abdominal pain, this involves an extensive number of treatment options, and is a complaint that carries with it a high risk of complications and morbidity.  The differential diagnosis includes PUD, IBS, diverticulitis, appendicitis, ectopic pregnancy, ovarian torsion, abscess, UTI, gastroenteritis.   Co morbidities that complicate the patient evaluation  See HPI   Additional history obtained:  Additional history obtained from EMR External records from outside source obtained and reviewed including Care Everywhere/External Records and Primary Care Documents  Lab Tests:  I Ordered, and personally interpreted labs.  The pertinent results include:   No leukocytosis noted.  No evidence of anemia.  Platelets within normal range.  No electrolyte abnormalities noted.  Renal function within normal limits.  No transaminitis noted.  Lipase within normal limits.  UA significant for HGb however patient is having a period.  Beta hCG blood negative.   Imaging Studies ordered:  NA   Cardiac Monitoring: / EKG:  The patient was maintained on a cardiac monitor.  I personally viewed and interpreted the cardiac monitored which showed an underlying rhythm of: sinus rhythm   Consultations Obtained:  N/A   Problem List / ED Course / Critical interventions / Medication management  Abdominal pain Vitals signs significant for BP 150/84. Otherwise within normal range and stable throughout visit. Laboratory/imaging studies significant for: See above On physical examination, patient is afebrile and appears in no acute distress.  She is currently having no pain or nausea or vomiting.  Doubt ectopic pregnancy due to negative hCG.  Doubt appendicitis due to  lack of fever and abdominal tenderness.  Doubt diverticulitis due to lack of fever, diarrhea.  Suspect this is IBS as patient states her symptoms improved with bowel movement and she has been having increased stress due to job promotion. 9:37 AM Reassessing patient.  Currently having no pain, nausea, vomiting. She passed PO challenge. I explained to her the diagnosis as well as treatment plan. All questions answered.  Patient voiced understanding and is agreeable to the plan.  She will follow-up with [facility] for further evaluation and treatment. I ordered medication including Zofran and  Bentyl for nausea and abdominal pain Reevaluation of the patient after these medicines showed that the patient improved I have reviewed the patients home medicines and have made adjustments as needed    Social Determinants of Health:  NA   Test / Admission - Considered:  Continued outpatient therapy with Zofran and bentyl. Follow-up with GI recommended for reevaluation of symptoms. Treatment plan discussed with patient.  Pt acknowledged understanding was agreeable to the plan. Worrisome signs and symptoms were discussed with patient, and patient acknowledged understanding to return to the ED if they noticed these signs and symptoms. Patient was stable upon discharge.          Final Clinical Impression(s) / ED Diagnoses Final diagnoses:  None    Rx / DC Orders ED Discharge Orders     None         Rex Kras, [place] 04/16/22 1706    Audley Hose, MD 04/19/22 2131

## 2022-04-15 NOTE — Telephone Encounter (Signed)
Transition Care Management Unsuccessful Follow-up Telephone Call  Date of discharge and from where:  04/15/22 [facility] ED  Attempts:  1st Attempt  Reason for unsuccessful TCM follow-up call:  Left voice message

## 2022-04-15 NOTE — Discharge Instructions (Addendum)
Your labs were reassuring today.  Please take Bentyl/tylenol/ibuprofen for abdominal pain and Zofran for nausea.  I recommend close follow-up with your PCP in 2 to 3 days for reevaluation.  Please do not hesitate to return to emergency department if worrisome signs symptoms we discussed become apparent.   GETTING TO GOOD BOWEL HEALTH. Irregular bowel habits such as constipation and diarrhea can lead to many problems over time.  Having one soft bowel movement a day is the most important way to prevent further problems.  The anorectal canal is designed to handle stretching and feces to safely manage our ability to get rid of solid waste (feces, poop, stool) out of our body.  BUT, hard constipated stools can act like ripping concrete bricks and diarrhea can be a burning fire to this very sensitive area of our body, causing inflamed hemorrhoids, anal fissures, increasing risk is perirectal abscesses, abdominal pain/bloating, an making irritable bowel worse.     The goal: ONE SOFT BOWEL MOVEMENT A DAY!  To have soft, regular bowel movements:  Drink at least 8 tall glasses of water a day.   Take plenty of fiber.  Fiber is the undigested part of plant food that passes into the colon, acting s "natures broom" to encourage bowel motility and movement.  Fiber can absorb and hold large amounts of water. This results in a larger, bulkier stool, which is soft and easier to pass. Work gradually over several weeks up to 6 servings a day of fiber (25g a day even more if needed) in the form of: Vegetables -- Root (potatoes, carrots, turnips), leafy green (lettuce, salad greens, celery, spinach), or cooked high residue (cabbage, broccoli, etc) Fruit -- Fresh (unpeeled skin & pulp), Dried (prunes, apricots, cherries, etc ),  or stewed ( applesauce)  Whole grain breads, pasta, etc (whole wheat)  Bran cereals  Bulking Agents -- This type of water-retaining fiber generally is easily obtained each day by one of the following:   Psyllium bran -- The psyllium plant is remarkable because its ground seeds can retain so much water. This product is available as Metamucil, Konsyl, Effersyllium, Per Diem Fiber, or the less expensive generic preparation in drug and health food stores. Although labeled a laxative, it really is not a laxative.  Methylcellulose -- This is another fiber derived from wood which also retains water. It is available as Citrucel. Polyethylene Glycol - and "artificial" fiber commonly called Miralax or Glycolax.  It is helpful for people with gassy or bloated feelings with regular fiber Flax Seed - a less gassy fiber than psyllium No reading or other relaxing activity while on the toilet. If bowel movements take longer than 5 minutes, you are too constipated AVOID CONSTIPATION.  High fiber and water intake usually takes care of this.  Sometimes a laxative is needed to stimulate more frequent bowel movements, but  Laxatives are not a good long-term solution as it can wear the colon out. Osmotics (Milk of Magnesia, Fleets phosphosoda, Magnesium citrate, MiraLax, GoLytely) are safer than  Stimulants (Senokot, Castor Oil, Dulcolax, Ex Lax)    Do not take laxatives for more than 7days in a row.  IF SEVERELY CONSTIPATED, try a Bowel Retraining Program: Do not use laxatives.  Eat a diet high in roughage, such as bran cereals and leafy vegetables.  Drink six (6) ounces of prune or apricot juice each morning.  Eat two (2) large servings of stewed fruit each day.  Take one (1) heaping tablespoon of a psyllium-based bulking  agent twice a day. Use sugar-free sweetener when possible to avoid excessive calories.  Eat a normal breakfast.  Set aside 15 minutes after breakfast to sit on the toilet, but do not strain to have a bowel movement.  If you do not have a bowel movement by the third day, use an enema and repeat the above steps.  Controlling diarrhea Switch to liquids and simpler foods for a few days to avoid  stressing your intestines further. Avoid dairy products (especially milk & ice cream) for a short time.  The intestines often can lose the ability to digest lactose when stressed. Avoid foods that cause gassiness or bloating.  Typical foods include beans and other legumes, cabbage, broccoli, and dairy foods.  Every person has some sensitivity to other foods, so listen to our body and avoid those foods that trigger problems for you. Adding fiber (Citrucel, Metamucil, psyllium, Miralax) gradually can help thicken stools by absorbing excess fluid and retrain the intestines to act more normally.  Slowly increase the dose over a few weeks.  Too much fiber too soon can backfire and cause cramping & bloating. Probiotics (such as active yogurt, Align, etc) may help repopulate the intestines and colon with normal bacteria and calm down a sensitive digestive tract.  Most studies show it to be of mild help, though, and such products can be costly. Medicines: Bismuth subsalicylate (ex. Kayopectate, Pepto Bismol) every 30 minutes for up to 6 doses can help control diarrhea.  Avoid if pregnant. Loperamide (Immodium) can slow down diarrhea.  Start with two tablets ('4mg'$  total) first and then try one tablet every 6 hours.  Avoid if you are having fevers or severe pain.  If you are not better or start feeling worse, stop all medicines and call your doctor for advice Call your doctor if you are getting worse or not better.  Sometimes further testing (cultures, endoscopy, X-ray studies, bloodwork, etc) may be needed to help diagnose and treat the cause of the diarrhea.  Managing Pain  Pain after surgery or related to activity is often due to strain/injury to muscle, tendon, nerves and/or incisions.  This pain is usually short-term and will improve in a few months.   Many people find it helpful to do the following things TOGETHER to help speed the process of healing and to get back to regular activity more  quickly:  Avoid heavy physical activity  no lifting greater than 20 pounds Do not "push through" the pain.  Listen to your body and avoid positions and maneuvers than reproduce the pain Walking is okay as tolerated, but go slowly and stop when getting sore.  Remember: If it hurts to do it, then don't do it! Take Anti-inflammatory medication  Take with food/snack around the clock for 1-2 weeks This helps the muscle and nerve tissues become less irritable and calm down faster Choose ONE of the following over-the-counter medications: Naproxen '220mg'$  tabs (ex. Aleve) 1-2 pills twice a day  Ibuprofen '200mg'$  tabs (ex. Advil, Motrin) 3-4 pills with every meal and just before bedtime Acetaminophen '500mg'$  tabs (Tylenol) 1-2 pills with every meal and just before bedtime Use a Heating pad or Ice/Cold Pack 4-6 times a day May use warm bath/hottub  or showers Try Gentle Massage and/or Stretching  at the area of pain many times a day stop if you feel pain - do not overdo it  Try these steps together to help you body heal faster and avoid making things get worse.  Doing just  one of these things may not be enough.    If you are not getting better after two weeks or are noticing you are getting worse, contact our office for further advice; we may need to re-evaluate you & see what other things we can do to help.

## 2022-04-17 ENCOUNTER — Ambulatory Visit (INDEPENDENT_AMBULATORY_CARE_PROVIDER_SITE_OTHER): Payer: No Typology Code available for payment source

## 2022-04-17 ENCOUNTER — Emergency Department (HOSPITAL_COMMUNITY)
Admission: EM | Admit: 2022-04-17 | Discharge: 2022-04-18 | Disposition: A | Discharge status: Home / Self Care | Payer: No Typology Code available for payment source | Attending: Emergency Medicine | Admitting: Emergency Medicine

## 2022-04-17 ENCOUNTER — Encounter (HOSPITAL_COMMUNITY): Payer: Self-pay

## 2022-04-17 ENCOUNTER — Other Ambulatory Visit: Payer: Self-pay

## 2022-04-17 ENCOUNTER — Emergency Department (HOSPITAL_COMMUNITY): Payer: No Typology Code available for payment source

## 2022-04-17 ENCOUNTER — Ambulatory Visit (HOSPITAL_COMMUNITY)
Admission: EM | Admit: 2022-04-17 | Discharge: 2022-04-17 | Disposition: A | Discharge status: Home / Self Care | Payer: No Typology Code available for payment source | Source: Home / Self Care

## 2022-04-17 DIAGNOSIS — K8020 Calculus of gallbladder without cholecystitis without obstruction: Secondary | ICD-10-CM | POA: Insufficient documentation

## 2022-04-17 DIAGNOSIS — R109 Unspecified abdominal pain: Secondary | ICD-10-CM | POA: Diagnosis not present

## 2022-04-17 DIAGNOSIS — Z20822 Contact with and (suspected) exposure to covid-19: Secondary | ICD-10-CM | POA: Insufficient documentation

## 2022-04-17 DIAGNOSIS — R1011 Right upper quadrant pain: Secondary | ICD-10-CM | POA: Diagnosis not present

## 2022-04-17 DIAGNOSIS — R1110 Vomiting, unspecified: Secondary | ICD-10-CM | POA: Diagnosis not present

## 2022-04-17 DIAGNOSIS — R1010 Upper abdominal pain, unspecified: Secondary | ICD-10-CM | POA: Insufficient documentation

## 2022-04-17 DIAGNOSIS — R112 Nausea with vomiting, unspecified: Secondary | ICD-10-CM

## 2022-04-17 DIAGNOSIS — R1012 Left upper quadrant pain: Secondary | ICD-10-CM | POA: Diagnosis present

## 2022-04-17 LAB — CBC WITH DIFFERENTIAL/PLATELET
Abs Immature Granulocytes: 0.02 10*3/uL (ref 0.00–0.07)
Abs Immature Granulocytes: 0.01 10*3/uL (ref 0.00–0.07)
Basophils Absolute: 0 10*3/uL (ref 0.0–0.1)
Basophils Absolute: 0 10*3/uL (ref 0.0–0.1)
Basophils Relative: 0 %
Basophils Relative: 0 %
Eosinophils Absolute: 0 10*3/uL (ref 0.0–0.5)
Eosinophils Absolute: 0 10*3/uL (ref 0.0–0.5)
Eosinophils Relative: 0 %
Eosinophils Relative: 0 %
HCT: 39.7 % (ref 36.0–46.0)
HCT: 41.9 % (ref 36.0–46.0)
Hemoglobin: 13.9 g/dL (ref 12.0–15.0)
Hemoglobin: 14.7 g/dL (ref 12.0–15.0)
Immature Granulocytes: 0 %
Immature Granulocytes: 0 %
Lymphocytes Relative: 18 %
Lymphocytes Relative: 23 %
Lymphs Abs: 1.3 10*3/uL (ref 0.7–4.0)
Lymphs Abs: 1.6 10*3/uL (ref 0.7–4.0)
MCH: 31.4 pg (ref 26.0–34.0)
MCH: 31.4 pg (ref 26.0–34.0)
MCHC: 35 g/dL (ref 30.0–36.0)
MCHC: 35.1 g/dL (ref 30.0–36.0)
MCV: 89.6 fL (ref 80.0–100.0)
MCV: 89.5 fL (ref 80.0–100.0)
Monocytes Absolute: 0.8 10*3/uL (ref 0.1–1.0)
Monocytes Absolute: 0.8 10*3/uL (ref 0.1–1.0)
Monocytes Relative: 12 %
Monocytes Relative: 11 %
Neutro Abs: 4.9 10*3/uL (ref 1.7–7.7)
Neutro Abs: 4.5 10*3/uL (ref 1.7–7.7)
Neutrophils Relative %: 70 %
Neutrophils Relative %: 66 %
Platelets: 391 10*3/uL (ref 150–400)
Platelets: 405 10*3/uL — ABNORMAL HIGH (ref 150–400)
RBC: 4.43 MIL/uL (ref 3.87–5.11)
RBC: 4.68 MIL/uL (ref 3.87–5.11)
RDW: 11.2 % — ABNORMAL LOW (ref 11.5–15.5)
RDW: 11.3 % — ABNORMAL LOW (ref 11.5–15.5)
WBC: 7 10*3/uL (ref 4.0–10.5)
WBC: 6.9 10*3/uL (ref 4.0–10.5)
nRBC: 0 % (ref 0.0–0.2)
nRBC: 0 % (ref 0.0–0.2)

## 2022-04-17 LAB — COMPREHENSIVE METABOLIC PANEL
ALT: 16 U/L (ref 0–44)
ALT: 18 U/L (ref 0–44)
AST: 21 U/L (ref 15–41)
AST: 16 U/L (ref 15–41)
Albumin: 4.2 g/dL (ref 3.5–5.0)
Albumin: 4.4 g/dL (ref 3.5–5.0)
Alkaline Phosphatase: 41 U/L (ref 38–126)
Alkaline Phosphatase: 43 U/L (ref 38–126)
Anion gap: 13 (ref 5–15)
Anion gap: 12 (ref 5–15)
BUN: 11 mg/dL (ref 6–20)
BUN: 11 mg/dL (ref 6–20)
CO2: 23 mmol/L (ref 22–32)
CO2: 25 mmol/L (ref 22–32)
Calcium: 9.1 mg/dL (ref 8.9–10.3)
Calcium: 9.3 mg/dL (ref 8.9–10.3)
Chloride: 99 mmol/L (ref 98–111)
Chloride: 98 mmol/L (ref 98–111)
Creatinine, Ser: 0.94 mg/dL (ref 0.44–1.00)
Creatinine, Ser: 0.9 mg/dL (ref 0.44–1.00)
GFR, Estimated: >60 mL/min (ref >60)
GFR, Estimated: >60 mL/min (ref >60)
Glucose, Bld: 79 mg/dL (ref 70–99)
Glucose, Bld: 84 mg/dL (ref 70–99)
Potassium: 3.8 mmol/L (ref 3.5–5.1)
Potassium: 3.4 mmol/L — ABNORMAL LOW (ref 3.5–5.1)
Sodium: 135 mmol/L (ref 135–145)
Sodium: 135 mmol/L (ref 135–145)
Total Bilirubin: 1 mg/dL (ref 0.3–1.2)
Total Bilirubin: 0.9 mg/dL (ref 0.3–1.2)
Total Protein: 7.6 g/dL (ref 6.5–8.1)
Total Protein: 8.3 g/dL — ABNORMAL HIGH (ref 6.5–8.1)

## 2022-04-17 LAB — URINALYSIS, ROUTINE W REFLEX MICROSCOPIC
Bilirubin Urine: NEGATIVE
Glucose, UA: NEGATIVE mg/dL
Ketones, ur: 80 mg/dL — AB
Leukocytes,Ua: NEGATIVE
Nitrite: NEGATIVE
Protein, ur: 30 mg/dL — AB
Specific Gravity, Urine: 1.025 (ref 1.005–1.030)
pH: 6 (ref 5.0–8.0)

## 2022-04-17 LAB — POCT URINALYSIS DIPSTICK, ED / UC
Glucose, UA: NEGATIVE mg/dL
Ketones, ur: >=160 mg/dL — AB
Leukocytes,Ua: NEGATIVE
Nitrite: NEGATIVE
Protein, ur: 30 mg/dL — AB
Specific Gravity, Urine: 1.02 (ref 1.005–1.030)
Urobilinogen, UA: 4 mg/dL — ABNORMAL HIGH (ref 0.0–1.0)
pH: 6 (ref 5.0–8.0)

## 2022-04-17 LAB — I-STAT CHEM 8, ED
BUN: 11 mg/dL (ref 6–20)
Calcium, Ion: 1.1 mmol/L — ABNORMAL LOW (ref 1.15–1.40)
Chloride: 97 mmol/L — ABNORMAL LOW (ref 98–111)
Creatinine, Ser: 0.9 mg/dL (ref 0.44–1.00)
Glucose, Bld: 83 mg/dL (ref 70–99)
HCT: 44 % (ref 36.0–46.0)
Hemoglobin: 15 g/dL (ref 12.0–15.0)
Potassium: 3.3 mmol/L — ABNORMAL LOW (ref 3.5–5.1)
Sodium: 136 mmol/L (ref 135–145)
TCO2: 25 mmol/L (ref 22–32)

## 2022-04-17 LAB — POC URINE PREG, ED: Preg Test, Ur: NEGATIVE

## 2022-04-17 LAB — LIPASE, BLOOD
Lipase: 26 U/L (ref 11–51)
Lipase: 27 U/L (ref 11–51)

## 2022-04-17 LAB — RESP PANEL BY RT-PCR (FLU A&B, COVID) ARPGX2
Influenza A by PCR: NEGATIVE
Influenza B by PCR: NEGATIVE
SARS Coronavirus 2 by RT PCR: NEGATIVE

## 2022-04-17 LAB — I-STAT BETA HCG BLOOD, ED (MC, WL, AP ONLY): I-stat hCG, quantitative: <5 m[IU]/mL (ref <5)

## 2022-04-17 MED ORDER — SUCRALFATE 1 G PO TABS: 1.0000 g | ORAL_TABLET | Freq: Three times a day (TID) | ORAL | 0 refills | Status: AC

## 2022-04-17 MED ORDER — LIDOCAINE VISCOUS HCL 2 % MT SOLN
15.0000 mL | Freq: Once | OROMUCOSAL | Status: AC
  Administered 2022-04-17: 15 mL via ORAL

## 2022-04-17 MED ORDER — PROMETHAZINE HCL 12.5 MG PO TABS: 12.5000 mg | ORAL_TABLET | Freq: Four times a day (QID) | ORAL | 0 refills | Status: AC | PRN

## 2022-04-17 MED ORDER — LIDOCAINE VISCOUS HCL 2 % MT SOLN
OROMUCOSAL | Status: AC
  Filled 2022-04-17: qty 15

## 2022-04-17 MED ORDER — ALUM & MAG HYDROXIDE-SIMETH 200-200-20 MG/5ML PO SUSP
ORAL | Status: AC
  Filled 2022-04-17: qty 30

## 2022-04-17 MED ORDER — ONDANSETRON 4 MG PO TBDP
4.0000 mg | ORAL_TABLET | Freq: Once | ORAL | Status: AC
  Administered 2022-04-17: 4 mg via ORAL

## 2022-04-17 MED ORDER — IOHEXOL 300 MG/ML  SOLN
100.0000 mL | Freq: Once | INTRAMUSCULAR | Status: AC | PRN
  Administered 2022-04-17: 100 mL via INTRAVENOUS

## 2022-04-17 MED ORDER — OXYCODONE-ACETAMINOPHEN 5-325 MG PO TABS: 1.0000 | ORAL_TABLET | Freq: Three times a day (TID) | ORAL | 0 refills | Status: AC | PRN

## 2022-04-17 MED ORDER — HYDROMORPHONE HCL 1 MG/ML IJ SOLN
1.0000 mg | Freq: Once | INTRAMUSCULAR | Status: AC
  Administered 2022-04-17: 1 mg via INTRAVENOUS
  Filled 2022-04-17: qty 1

## 2022-04-17 MED ORDER — SODIUM CHLORIDE 0.9 % IV BOLUS
1000.0000 mL | Freq: Once | INTRAVENOUS | Status: AC
  Administered 2022-04-17: 1000 mL via INTRAVENOUS

## 2022-04-17 MED ORDER — ALUM & MAG HYDROXIDE-SIMETH 200-200-20 MG/5ML PO SUSP
30.0000 mL | Freq: Once | ORAL | Status: AC
  Administered 2022-04-17: 30 mL via ORAL

## 2022-04-17 MED ORDER — KETOROLAC TROMETHAMINE 30 MG/ML IJ SOLN
30.0000 mg | Freq: Once | INTRAMUSCULAR | Status: AC
  Administered 2022-04-17: 30 mg via INTRAVENOUS
  Filled 2022-04-17: qty 1

## 2022-04-17 MED ORDER — ONDANSETRON 4 MG PO TBDP
ORAL_TABLET | ORAL | Status: AC
  Filled 2022-04-17: qty 1

## 2022-04-17 MED ORDER — ONDANSETRON HCL 4 MG/2ML IJ SOLN
4.0000 mg | Freq: Once | INTRAMUSCULAR | Status: AC
  Administered 2022-04-17: 4 mg via INTRAVENOUS
  Filled 2022-04-17: qty 2

## 2022-04-17 NOTE — ED Triage Notes (Signed)
Pt was seen in the ER 04/14/2022  for vomiting, stomach pain . Pt states medication perscribed is not helping but, making her feel worse

## 2022-04-17 NOTE — Discharge Instructions (Signed)
Use promethazine for nausea.  This make you sleepy so do not drive or drink alcohol with taking it.  Use Carafate before each meal and before bed to help coat your stomach.  Avoid spicy/acidic/fatty foods.  Make sure you are drinking plenty of fluid.  Eat a bland diet.  Follow-up with GI as scheduled on 04/20/2022.  If your symptoms are not improving and you have severe abdominal pain, fever, blood in your stool, blood in your vomit, nausea/vomiting interfering with oral intake despite medication, weakness you need to go to the emergency room immediately.  We will contact you with your lab work if anything is abnormal and we need to change our treatment plan.

## 2022-04-17 NOTE — ED Provider Notes (Signed)
[facility] DEPT Provider Note   CSN: [id number] Arrival date & time: 04/17/22  1706     History  Chief Complaint  Patient presents with   Abdominal Pain   HPI Erin [name] is a 36 y.o. female presenting with no significant medical history for abdominal pain.  Abdominal pain started Wednesday.  Located in her left upper quadrant.  She also endorses significant vomiting since Wednesday as well.  The pain is [name] day long and nonstop.  Pain is 10 out of 10 feels achy and throbbing.  Was seen at [facility], ED on 10/5 treated for IBS.  Sent home with Zofran and Bentyl.  Seen today in urgent care for the same complaint.  Prescribed promethazine for nausea and advised to be evaluated in the ED if symptoms worsen.  She denies fever.  States that her last good bowel movement was Tuesday of this past week.   Abdominal Pain      Home Medications Prior to Admission medications   Medication Sig Start Date End Date Taking? Authorizing Provider  promethazine (PHENERGAN) 12.5 MG tablet Take 1 tablet (12.5 mg total) by mouth every 6 (six) hours as needed for nausea or vomiting. 04/17/22   Raspet, Junie Panning K, PA-C  rizatriptan (MAXALT-MLT) 10 MG disintegrating tablet Take 1 tablet earliest onset of headache.  May repeat in 2 hours if needed.  Maximum 2 tablets in 24 hours. 01/13/22   Tomi Likens, Adam R, DO  sucralfate (CARAFATE) 1 g tablet Take 1 tablet (1 g total) by mouth 4 (four) times daily -  with meals and at bedtime. 04/17/22   Raspet, Derry Skill, PA-C      Allergies    Patient has no known allergies.    Review of Systems   Review of Systems  Gastrointestinal:  Positive for abdominal pain.    Physical Exam Updated Vital Signs BP (!) 142/93   Pulse 66   Temp 99.2 F (37.3 C) (Oral)   Resp 16   LMP 04/16/2022   SpO2 100%  Physical Exam Vitals and nursing note reviewed.  HENT:     Head: Normocephalic and atraumatic.     Mouth/Throat:     Mouth: Mucous  membranes are moist.  Eyes:     General:        Right eye: No discharge.        Left eye: No discharge.     Conjunctiva/sclera: Conjunctivae normal.  Cardiovascular:     Rate and Rhythm: Normal rate and regular rhythm.     Pulses: Normal pulses.     Heart sounds: Normal heart sounds.  Pulmonary:     Effort: Pulmonary effort is normal.     Breath sounds: Normal breath sounds.  Abdominal:     General: Abdomen is flat.     Palpations: Abdomen is soft.     Tenderness: There is abdominal tenderness in the left upper quadrant. There is no right CVA tenderness or left CVA tenderness.  Skin:    General: Skin is warm and dry.  Neurological:     General: No focal deficit present.  Psychiatric:        Mood and Affect: Mood normal.     ED Results / Procedures / Treatments   Labs ([name] labs ordered are listed, but only abnormal results are displayed) Labs Reviewed  CBC WITH DIFFERENTIAL/PLATELET - Abnormal; Notable for the following components:      Result Value   RDW 11.3 (*)  Platelets 405 (*)    [name] other components within normal limits  COMPREHENSIVE METABOLIC PANEL - Abnormal; Notable for the following components:   Potassium 3.4 (*)    Total Protein 8.3 (*)    [name] other components within normal limits  URINALYSIS, ROUTINE W REFLEX MICROSCOPIC - Abnormal; Notable for the following components:   APPearance HAZY (*)    Hgb urine dipstick SMALL (*)    Ketones, ur 80 (*)    Protein, ur 30 (*)    Bacteria, UA MANY (*)    [name] other components within normal limits  I-STAT CHEM 8, ED - Abnormal; Notable for the following components:   Potassium 3.3 (*)    Chloride 97 (*)    Calcium, Ion 1.10 (*)    [name] other components within normal limits  RESP PANEL BY RT-PCR (FLU A&B, COVID) ARPGX2  LIPASE, BLOOD  I-STAT BETA HCG BLOOD, ED ([facility], [facility], [facility] ONLY)    EKG None  Radiology US Abdomen Limited RUQ (LIVER/GB)  Result Date: 04/17/2022 CLINICAL DATA:  Right upper quadrant pain for  5 days. EXAM: ULTRASOUND ABDOMEN LIMITED RIGHT UPPER QUADRANT COMPARISON:  CT 04/17/2022 FINDINGS: Gallbladder: Single gallstone in the gallbladder neck measuring 1.9 cm diameter. No gallbladder wall thickening or edema. Murphy's sign is negative. Common bile duct: Diameter: 4 mm, normal Liver: No focal lesion identified. Within normal limits in parenchymal echogenicity. Portal vein is patent on color Doppler imaging with normal direction of blood flow towards the liver. Other: None. IMPRESSION: Cholelithiasis without additional evidence to suggest acute cholecystitis. Electronically Signed   By: Lucienne Capers M.D.   On: 04/17/2022 21:31   CT ABDOMEN PELVIS W CONTRAST  Result Date: 04/17/2022 CLINICAL DATA:  Pain left upper quadrant of abdomen, nausea, vomiting, diarrhea EXAM: CT ABDOMEN AND PELVIS WITH CONTRAST TECHNIQUE: Multidetector CT imaging of the abdomen and pelvis was performed using the standard protocol following bolus administration of intravenous contrast. RADIATION DOSE REDUCTION: This exam was performed according to the departmental dose-optimization program which includes automated exposure control, adjustment of the mA and/or kV according to patient size and/or use of iterative reconstruction technique. CONTRAST:  117m OMNIPAQUE IOHEXOL 300 MG/ML  SOLN COMPARISON:  None Available. FINDINGS: Lower chest: Unremarkable. Hepatobiliary: There is fatty infiltration in liver. There is large ring-like calcific density in the lumen of gallbladder. There is wall thickening in gallbladder. There is mild pericholecystic stranding. There is no dilation of bile ducts. Pancreas: No focal abnormalities are seen. Spleen: Unremarkable. Adrenals/Urinary Tract: Adrenals are unremarkable. There is no hydronephrosis. There are no renal or ureteral stones. Urinary bladder is not distended. Stomach/Bowel: Stomach is unremarkable. Small bowel loops are not dilated. Appendix is not dilated. Tip of appendix is noted  in right pelvic cavity. There is no significant wall thickening in colon. There is no pericolic stranding. Vascular/Lymphatic: Unremarkable. Reproductive: Uterus is retroverted. There is 2.1 cm pedunculated fibroid in the posterior margin of the fundus of the uterus. Other: There is no ascites or pneumoperitoneum. Musculoskeletal: No acute findings are seen. IMPRESSION: Gallbladder stone. There is mild diffuse wall thickening in gallbladder along with pericholecystic stranding. Possibility of acute cholecystitis is not excluded. Gallbladder sonogram may be considered for further evaluation. There is no dilation of bile ducts. There is no evidence of intestinal obstruction or pneumoperitoneum. Appendix is not dilated. There is no hydronephrosis. Fatty liver.  Uterine fibroid. Electronically Signed   By: PElmer PickerM.D.   On: 04/17/2022 20:37   DG Abdomen 1 View  Result Date: 04/17/2022 CLINICAL DATA:  Persistent nausea and vomiting EXAM: ABDOMEN - 1 VIEW COMPARISON:  Abdominal radiograph February 26, 2021 FINDINGS: The bowel gas pattern is normal. No radio-opaque calculi are seen. Metallic density projecting over the L5 vertebral body, likely external to the patient. No acute osseous abnormality. IMPRESSION: Normal bowel-gas pattern. Electronically Signed   By: Beryle Flock M.D.   On: 04/17/2022 11:18    Procedures Procedures    Medications Ordered in ED Medications  HYDROmorphone (DILAUDID) injection 1 mg (has no administration in time range)  iohexol (OMNIPAQUE) 300 MG/ML solution 100 mL (100 mLs Intravenous Contrast Given 04/17/22 2023)  ketorolac (TORADOL) 30 MG/ML injection 30 mg (30 mg Intravenous Given 04/17/22 2115)  sodium chloride 0.9 % bolus 1,000 mL (1,000 mLs Intravenous New Bag/Given 04/17/22 2114)  ondansetron (ZOFRAN) injection 4 mg (4 mg Intravenous Given 04/17/22 2114)    ED Course/ Medical Decision Making/ A&P                           Medical Decision  Making Risk Prescription drug management.   This patient presents to the ED for concern of abdominal, this involves a number of treatment options, and is a complaint that carries with it a high risk of complications and morbidity.  The differential diagnosis for generalized abdominal pain includes, but is not limited to bladder pathology, pancreatitis, diverticulitis, mesenteric ischemia, and pyelonephritis.  Co morbidities: Discussed in HPI   EMR reviewed including pt PMHx, past surgical history and past visits to ER.  Reviewed prior ED visit on 10/5 and urgent care visit today.  See HPI for more details   Lab Tests:   I ordered and independently interpreted labs. Labs notable for hypokalemia and bacteriuria   Imaging Studies:  Abnormal findings. I personally reviewed [name] imaging studies. Imaging notable for  CT scan revealed mild diffuse thickening of the gallbladder wall.  Radiologist advised sonogram of the gallbladder.  Ultrasound revealed cholelithiasis without evidence of acute cholecystitis.    Cardiac Monitoring:  The patient was maintained on a cardiac monitor.  I personally viewed and interpreted the cardiac monitored which showed an underlying rhythm of: NSR NA   Medicines ordered:  I ordered medication including Toradol for pain, Zofran for nausea, normal saline bolus for Reevaluation of the patient after these medicines showed that the patient stayed the same.  Stated that after receiving Toradol pain was about the same.  Treated with Dilaudid.  Oncoming APP will follow up with on pain control. I have reviewed the patients home medicines and have made adjustments as needed   Critical Interventions:  No critical interventions at this time   Consults/Attending Physician    Signed out patient to oncoming APP. We discussed with Dr. Roderic Palau that we will likely plan to adequately control her pain and have her f/u with generally surgery outpatient for ongoing  cholelithiasis.    Reevaluation:  After the interventions noted above I re-evaluated patient and found that they have :stayed the same    Problem List / ED Course: Patient presented for abdominal pain. CT scan revealed concerned for cholelithiasis versus acute cholecystitis.  Radiologist recommended ultrasound.  Ultrasound of the gallbladder revealed cholelithiasis without evidence of acute cholecystitis.  Upon reevaluation of her pain patient stated that her pain was about the same  At that point, treat her pain with Dilaudid.  Discussed with oncoming APP and Dr. Roderic Palau that we will attempt to adequately control  her pain and have her follow-up with general surgery outpatient regarding ongoing cholelithiasis without acute cholecystitis.  Reassuring that she does not have a fever, tachycardia normal white blood cell count that would indicate an infection.   Disposition undetermined at this time.  Signed out patient to oncoming APP.         Final Clinical Impression(s) / ED Diagnoses Final diagnoses:  Calculus of gallbladder without cholecystitis without obstruction    Rx / DC Orders ED Discharge Orders     None         Harriet Pho, PA-C 04/17/22 2218    Milton Ferguson, MD 04/18/22 1005

## 2022-04-17 NOTE — ED Provider Triage Note (Signed)
Emergency Medicine Provider Triage Evaluation Note  Erin [name] , [name] 36 y.o. female  was evaluated in triage.  Pt complains of N/V/D and abd pain. This is 3rd visit for similar. Taking zofran and bentyl without relief. Some cough.  Review of Systems  Positive: Abd pain, N/V/D Negative: fever  Physical Exam  BP (!) 150/98 (BP Location: Right Arm)   Pulse 81   Temp 98.2 F (36.8 C) (Oral)   Resp 16   LMP 04/16/2022   SpO2 98%  Gen:   Awake, no distress   Resp:  Normal effort  MSK:   Moves extremities without difficulty  Other:    Medical Decision Making  Medically screening exam initiated at 5:15 PM.  Appropriate orders placed.  Erin [name] was informed that the remainder of the evaluation will be completed by another provider, this initial triage assessment does not replace that evaluation, and the importance of remaining in the ED until their evaluation is complete.  N/V/D, abd pain   Erin Ackerley A, PA-C 04/17/22 1717

## 2022-04-17 NOTE — ED Provider Notes (Signed)
[place]    CSN: [id number] Arrival date & time: 04/17/22  1011      History   Chief Complaint Chief Complaint  Patient presents with   Abdominal Pain   Nausea   Emesis    HPI Erin [name] is a 36 y.o. female.   Patient presents today accompanied by her mother.  Reports a 4-day history of GI symptoms.  She reports upper abdominal pain, nausea, vomiting.  Denies any diarrhea.  She reports that her last bowel movement was a few days ago and was small but normal without blood or mucus.  Pain is rated 10 on a 0-10 pain scale, described as aching, no aggravating relieving factors identified.  She denies any urinary symptoms.  She denies any concern for STI but is interested in testing.  Denies any pelvic pain, abdominal pain, vaginal discharge, fever, nausea, vomiting.  Symptoms began after eating a [place] biscuit and have been persistent since that time.  She has had difficulty eating and drinking as result of her current symptoms.  She was seen in the emergency room on 04/14/2022 at which point work-up was essentially negative and she was started on ondansetron and dicyclomine.  No imaging was performed.  She continues to have symptoms despite medication.  She denies history of gastrointestinal disorder but has had some GI symptoms and been managing this with dietary changes at the direction of her PCP.  She has not seen a specialist denies any history of ulcerative colitis, Crohn's disease, GERD.  Denies any recent medication changes, antibiotics, recent travel, known sick contacts.  She reports generally feeling weak as result of decreased oral intake given severity of symptoms.    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 Authorizing Provider  promethazine (PHENERGAN) 12.5 MG tablet Take 1 tablet (12.5 mg total) by mouth every 6 (six) hours as needed for nausea or vomiting. 04/17/22  Yes Dianna Deshler K, PA-C  sucralfate (CARAFATE) 1 g tablet Take 1 tablet (1 g total) by mouth 4 (four) times daily -  with meals and at bedtime. 04/17/22  Yes Doyle Kunath K, PA-C  rizatriptan (MAXALT-MLT) 10 MG disintegrating tablet Take 1 tablet earliest onset of headache.  May repeat in 2 hours if needed.  Maximum 2 tablets in 24 hours. 01/13/22   Pieter Partridge,  DO    Family History Family History  Problem Relation Age of Onset   Hypertension Mother     Social History Social History   Tobacco Use   Smoking status: Never   Smokeless tobacco: Never  Vaping Use   Vaping Use: Never used  Substance Use Topics   Alcohol use: Yes    Alcohol/week: 1.0 standard drink of alcohol    Types: 1 Glasses of wine per week    Comment: Rare   Drug use: Not Currently    Frequency: 6.0 times per week    Types: Marijuana    Comment: marijuana every other day     Allergies   Patient has no known allergies.   Review of Systems Review of Systems  Constitutional:  Positive for activity change. Negative for appetite change, fatigue and fever.  Respiratory:  Negative for cough and shortness of breath.   Cardiovascular:  Negative for chest pain.  Gastrointestinal:  Positive for abdominal pain, nausea  and vomiting. Negative for blood in stool, constipation and diarrhea.  Genitourinary:  Negative for dysuria, frequency, pelvic pain, urgency, vaginal bleeding, vaginal discharge and vaginal pain.  Neurological:  Negative for dizziness, light-headedness and headaches.     Physical Exam Triage Vital Signs ED Triage Vitals  Enc Vitals Group     BP 04/17/22 1035 132/88     Pulse --      Resp 04/17/22 1035 12     Temp 04/17/22 1035 99.4 F (37.4 C)     Temp Source 04/17/22 1035 Oral     SpO2 04/17/22 1035 100 %     Weight --      Height --      Head Circumference --      Peak Flow --      Pain Score 04/17/22 1030 10     Pain Loc --      Pain Edu? --      Excl. in [place]? --    No data found.  Updated Vital Signs BP 132/88 (BP Location: Left Arm)   Pulse 82   Temp 99.4 F (37.4 C) (Oral)   Resp 12   LMP 04/16/2022   SpO2 100%   Visual Acuity Right Eye Distance:   Left Eye Distance:   Bilateral Distance:    Right Eye Near:   Left Eye Near:    Bilateral Near:     Physical Exam Vitals reviewed.  Constitutional:      General: She is awake. She is not in acute distress.    Appearance: Normal appearance. She is well-developed. She is not ill-appearing.     Comments: Very pleasant female appears stated age in no acute distress sitting comfortably in exam room  HENT:     Head: Normocephalic and atraumatic.     Mouth/Throat:     Mouth: Mucous membranes are moist.     Pharynx: Uvula midline. No oropharyngeal exudate or posterior oropharyngeal erythema.  Cardiovascular:     Rate and Rhythm: Normal rate and regular rhythm.     Heart sounds: Normal heart sounds, S1 normal and S2 normal. No murmur heard. Pulmonary:     Effort: Pulmonary effort is normal.     Breath sounds: Normal breath sounds. No wheezing, rhonchi or rales.     Comments: Clear to auscultation bilaterally Abdominal:     General: Bowel sounds are normal.     Palpations: Abdomen is soft.     Tenderness: There  is abdominal tenderness in the right  upper quadrant, epigastric area and left upper quadrant. There is no right CVA tenderness, left CVA tenderness, guarding or rebound.  Psychiatric:        Behavior: Behavior is cooperative.      UC Treatments / Results  Labs (all labs ordered are listed, but only abnormal results are displayed) Labs Reviewed  POCT URINALYSIS DIPSTICK, ED / UC - Abnormal; Notable for the following components:      Result Value   Bilirubin Urine MODERATE (*)    Ketones, ur >=160 (*)    Hgb urine dipstick LARGE (*)    Protein, ur 30 (*)    Urobilinogen, UA 4.0 (*)    All other components within normal limits  CBC WITH DIFFERENTIAL/PLATELET  COMPREHENSIVE METABOLIC PANEL  LIPASE, BLOOD  POC URINE PREG, ED    EKG   Radiology DG Abdomen 1 View  Result Date: 04/17/2022 CLINICAL DATA:  Persistent nausea and vomiting EXAM: ABDOMEN - 1 VIEW COMPARISON:  Abdominal radiograph February 26, 2021 FINDINGS: The bowel gas pattern is normal. No radio-opaque calculi are seen. Metallic density projecting over the L5 vertebral body, likely external to the patient. No acute osseous abnormality. IMPRESSION: Normal bowel-gas pattern. Electronically Signed   By: Beryle Flock M.D.   On: 04/17/2022 11:18    Procedures Procedures (including critical care time)  Medications Ordered in UC Medications  ondansetron (ZOFRAN-ODT) disintegrating tablet 4 mg (4 mg Oral Given 04/17/22 1100)  alum & mag hydroxide-simeth (MAALOX/MYLANTA) 200-200-20 MG/5ML suspension 30 mL (30 mLs Oral Given 04/17/22 1101)    And  lidocaine (XYLOCAINE) 2 % viscous mouth solution 15 mL (15 mLs Oral Given 04/17/22 1101)    Initial Impression / Assessment and Plan / UC Course  I have reviewed the triage vital signs and the nursing notes.  Pertinent labs & imaging results that were available during my care of the patient were reviewed by me and considered in my medical decision making (see chart for  details).     Patient is well-appearing, afebrile, nontoxic, nontachycardic.  Vital signs and physical exam are reassuring today.  KUB was obtained that showed no acute abnormality.  CBC, CMP, lipase repeated today but are pending.  We will contact patient with any abnormalities.  She was given Zofran and GI cocktail in clinic with improvement but not resolution of symptoms.  She is scheduled to see GI on 04/20/2022 we discussed the importance of keeping this appointment.  She was prescribed promethazine since Zofran has been ineffective in managing her during her nausea.  She was also started on Carafate.  Discussed that she should eat small frequent meals and drink plenty of fluid.  She was able to sip water without recurrent nausea/vomiting in clinic.  UA showed evidence of decreased oral intake without evidence of infection.  Urine pregnancy was negative in clinic.  We discussed that if she has persistent or worsening symptoms she would need to go back to the emergency room since we do not have advanced imaging Abilities in urgent care.  I am hopeful that the medication will help provide enough relief of symptoms that she can follow-up with GI as scheduled next week.  We discussed that if she has worsening pain, nausea/vomiting interfere with oral intake despite medication, melena, hematochezia, hematemesis, fever she needs to go to the emergency room.  Strict return precautions given.  Work excuse note provided.  Final Clinical Impressions(s) / UC Diagnoses   Final diagnoses:  Upper abdominal pain  Nausea and vomiting, unspecified  vomiting type     Discharge Instructions      Use promethazine for nausea.  This make you sleepy so do not drive or drink alcohol with taking it.  Use Carafate before each meal and before bed to help coat your stomach.  Avoid spicy/acidic/fatty foods.  Make sure you are drinking plenty of fluid.  Eat a bland diet.  Follow-up with GI as scheduled on 04/20/2022.  If  your symptoms are not improving and you have severe abdominal pain, fever, blood in your stool, blood in your vomit, nausea/vomiting interfering with oral intake despite medication, weakness you need to go to the emergency room immediately.  We will contact you with your lab work if anything is abnormal and we need to change our treatment plan.     ED Prescriptions     Medication Sig Dispense Auth. Provider   sucralfate (CARAFATE) 1 g tablet Take 1 tablet (1 g total) by mouth 4 (four) times daily -  with meals and at bedtime. 28 tablet Wojciech Willetts K, PA-C   promethazine (PHENERGAN) 12.5 MG tablet Take 1 tablet (12.5 mg total) by mouth every 6 (six) hours as needed for nausea or vomiting. 30 tablet Avenly Roberge, Derry Skill, PA-C      PDMP not reviewed this encounter.   Terrilee Croak, PA-C 04/17/22 1150

## 2022-04-17 NOTE — ED Provider Notes (Signed)
Received at shift change from Joanette Gula, PA-C please see note for full detail  Epigastric/left upper quadrant pain, started on Wednesday, has had nausea and vomiting, states she is passing gas and having normal bowel movements, denies melena or hematochezia, no significant abdominal history no associate fevers or chills she has no other complaints.  Was seen in the ED 2 days earlier for similar presentation had a benign work-up and was sent home and referred to GI.  Per previous provider reassessed the patient feeling better can be sent home.   Physical Exam  BP 135/85   Pulse 82   Temp 98.3 F (36.8 C) (Oral)   Resp 16   LMP 04/16/2022   SpO2 98%   Physical Exam Vitals and nursing note reviewed.  Constitutional:      General: She is not in acute distress.    Appearance: Normal appearance. She is not ill-appearing or diaphoretic.  HENT:     Head: Normocephalic and atraumatic.     Nose: No congestion.  Eyes:     Conjunctiva/sclera: Conjunctivae normal.  Cardiovascular:     Rate and Rhythm: Normal rate and regular rhythm.  Pulmonary:     Effort: Pulmonary effort is normal.     Breath sounds: Normal breath sounds.  Skin:    General: Skin is warm and dry.  Neurological:     Mental Status: She is alert.  Psychiatric:        Mood and Affect: Mood normal.     Procedures  Procedures  ED Course / MDM    Medical Decision Making Amount and/or Complexity of Data Reviewed Radiology: ordered.  Risk Prescription drug management.    Lab Tests:  I Ordered, and personally interpreted labs.  The pertinent results include: CBC unremarkable, CMP shows potassium 3.4 protein 8.3 lipase 27, UA negative for nitrates leukocytes shows red blood cells white cells many bacteria squamous cells present   Imaging Studies ordered:  I ordered imaging studies including CT abdomen pelvis, limited ultrasound I independently visualized and interpreted imaging which showed limited  ultrasound shows cholelithiasis without acute cholecystitis, CT and pelvis show some inflammation around the gallbladder without definite signs of acute cholecystitis. I agree with the radiologist interpretation   Cardiac Monitoring:  The patient was maintained on a cardiac monitor.  I personally viewed and interpreted the cardiac monitored which showed an underlying rhythm of: N/A   Medicines ordered and prescription drug management:  I ordered medication including N/A I have reviewed the patients home medicines and have made adjustments as needed  Critical Interventions:  N/A   Reevaluation:  Patient was reassessed, she was given Toradol, Zofran, fluids as well as Dilaudid, states that she is feeling better, she is endorsing any pain at this time, states she is tolerating p.o., she is agreement with plan and discharge at this time.    Consultations Obtained:  N/A    Test Considered:  Consider admission-but will defer as patient is stable at this time, pain is under control, she is tolerating p.o., there is no evidence of acute cholecystitis or evidence of intra-abdominal infection.    Rule out  I have low suspicion for biliary or hepatic abnormalities no elevation liver signs alk phos or T. bili, she has no evidence of acute cholecystitis seen on ultrasound, this correlates with her vital signs which were unremarkable as well as no leukocytosis.  Low suspicion for pancreatitis as lipase is within normal limits.  Suspicion for bowel obstruction, volvulus, diverticulitis, AAA, is  low at this time as CT imaging is all negative these findings.    Dispostion and problem list  After consideration of the diagnostic results and the patients response to treatment, I feel that the patent would benefit from discharge.  Abdominal pain-suspect she is suffering from biliary colic, sent a internal referral to general surgery, provided with pain medications, instructed to follow-up  with general surgery for further evaluation and strict return precautions.           Marcello Fennel, PA-C 04/18/22 0001    Veryl Speak, MD 04/18/22 [phone]

## 2022-04-17 NOTE — Discharge Instructions (Addendum)
Likely your pain is from the stones in your gallbladder, I have given you some information in regards to eating habits will decrease gallbladder inflammation please review please continue to take Zofran and Phenergan as needed for nausea also given you pain medications please take as prescribed  I have given you a short course of narcotics please take as prescribed.  This medication can make you drowsy do not consume alcohol or operate heavy machinery when taking this medication.  This medication is Tylenol in it do not take Tylenol and take this medication.    I have sent a internal referral to general surgery, have also given the contact information please call tomorrow for immediate follow-up.  Come back to the emergency department if you develop chest pain, shortness of breath, severe abdominal pain, uncontrolled nausea, vomiting, diarrhea.

## 2022-04-17 NOTE — ED Triage Notes (Signed)
BIBA from home for LUQ abd pain radiating to flank with N/v x5 days. Seen at ER and urgent care with nausea meds prescribed.

## 2022-04-18 NOTE — Progress Notes (Deleted)
     04/18/2022 Darbi Chandran [id number] 03-Jan-1986  Referring provider: Flossie Buffy, NP Primary GI doctor: {acdocs:27040}  ASSESSMENT AND PLAN:   There are no diagnoses linked to this encounter.   Patient Care Team: Nche, Charlene Brooke, NP as PCP - General (Internal Medicine) Fontaine, Belinda Block, MD (Inactive) as Attending Physician (Obstetrics and Gynecology) Nobie Putnam, MD (Hematology and Oncology)  HISTORY OF PRESENT ILLNESS: 36 y.o. female with a past medical history of asthma, obesity and others listed below, presents for ER follow up on 04/17/22 for abdominal pain.  Patient's had 3 ER visits for abdominal discomfort since 04/14/2022 04/17/2022 CT abdomen pelvis showed fatty liver, uterine fibroid, gallbladder stone with diffuse wall thickening no ductal dilatation 04/17/2022 right upper quadrant ultrasound cholelithiasis without evidence of acute cholecystitis with 1.9 cm stone at gallbladder neck  She  reports that she has never smoked. She has never used smokeless tobacco. She reports current alcohol use of about 1.0 standard drink of alcohol per week. She reports that she does not currently use drugs after having used the following drugs: Marijuana. Frequency: 6.00 times per week.  Current Medications:     Current Outpatient Medications (Respiratory):    promethazine (PHENERGAN) 12.5 MG tablet, Take 1 tablet (12.5 mg total) by mouth every 6 (six) hours as needed for nausea or vomiting.  Current Outpatient Medications (Analgesics):    oxyCODONE-acetaminophen (PERCOCET/ROXICET) 5-325 MG tablet, Take 1 tablet by mouth every 8 (eight) hours as needed for up to 3 days for severe pain.   rizatriptan (MAXALT-MLT) 10 MG disintegrating tablet, Take 1 tablet earliest onset of headache.  May repeat in 2 hours if needed.  Maximum 2 tablets in 24 hours. (Patient not taking: Reported on 04/17/2022)   Current Outpatient Medications (Other):    dicyclomine (BENTYL) 20 MG  tablet, Take 20 mg by mouth every 6 (six) hours.   ondansetron (ZOFRAN) 4 MG tablet, Take 8 mg by mouth 2 (two) times daily.   sucralfate (CARAFATE) 1 g tablet, Take 1 tablet (1 g total) by mouth 4 (four) times daily -  with meals and at bedtime.  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 Family History:  Her family history includes Hypertension in her mother.  REVIEW OF SYSTEMS  : All other systems reviewed and negative except where noted in the History of Present Illness.  PHYSICAL EXAM: LMP 04/16/2022  General:   Pleasant, well developed female in no acute distress Head:   Normocephalic and atraumatic. Eyes:  sclerae anicteric,conjunctive pink  Heart:   {HEART EXAM HEM/ONC:21750} Pulm:  Clear anteriorly; no wheezing Abdomen:   {BlankSingle:19197::"Distended","Ridged","Soft"}, {BlankSingle:19197::"Flat","Obese","Non-distended"} AB, {BlankSingle:19197::"Absent","Hyperactive, tinkling","Hypoactive","Sluggish","Active"} bowel sounds. {actendernessAB:27319} tenderness {anatomy; site abdomen:5010}. {BlankMultiple:19196::"Without guarding","With guarding","Without rebound","With rebound"}, No organomegaly appreciated. Rectal: {acrectalexam:27461} Extremities:  {With/Without:304960234} edema. Msk: Symmetrical without gross deformities. Peripheral pulses intact.  Neurologic:  Alert and  oriented x4;  No focal deficits.  Skin:   Dry and intact without significant lesions or rashes. Psychiatric:  Cooperative. Normal mood and affect.    Vladimir Crofts, PA-C 9:17 AM

## 2022-04-18 NOTE — Telephone Encounter (Signed)
Transition Care Management Unsuccessful Follow-up Telephone Call  Date of discharge and from where:  04/15/22 [facility] ED  Attempts:  2nd Attempt  Reason for unsuccessful TCM follow-up call:  Left voice message

## 2022-04-20 ENCOUNTER — Ambulatory Visit: Payer: No Typology Code available for payment source | Admitting: Physician Assistant

## 2022-04-21 ENCOUNTER — Telehealth: Payer: Self-pay

## 2022-04-21 NOTE — Telephone Encounter (Signed)
Transition Care Management Follow-up Telephone Call Date of discharge and from where: 108/23 [facility] ED How have you been since you were released from the hospital? I'm doing ok Any questions or concerns? No  Items Reviewed: Did the pt receive and understand the discharge instructions provided? Yes  Medications obtained and verified? No  Other? No  Any new allergies since your discharge? No  Dietary orders reviewed? No Do you have support at home? Yes   Home Care and Equipment/Supplies: Were home health services ordered? not applicable If so, what is the name of the agency? N/a  Has the agency set up a time to come to the patient's home? not applicable Were any new equipment or medical supplies ordered?  No What is the name of the medical supply agency? N/a Were you able to get the supplies/equipment? not applicable Do you have any questions related to the use of the equipment or supplies? No  Functional Questionnaire: (I = Independent and D = Dependent) ADLs: I  Bathing/Dressing- I  Meal Prep- I  Eating- I  Maintaining continence- I  Transferring/Ambulation- I  Managing Meds- I  Follow up appointments reviewed:  PCP Hospital f/u appt confirmed? Yes  Scheduled to see Wilfred Lacy on 05/04/22 @ 8:20am. [facility] f/u appt confirmed? Yes  Scheduled to see Surgeon on 04/25/22 @ unknown. Are transportation arrangements needed? No  If their condition worsens, is the pt aware to call PCP or go to the Emergency Dept.? Yes Was the patient provided with contact information for the PCP's office or ED? Yes Was to pt encouraged to call back with questions or concerns? Yes   Angeline Slim, RN, BSN

## 2022-04-21 NOTE — Telephone Encounter (Signed)
Transition Care Management Unsuccessful Follow-up Telephone Call  Date of discharge and from where:  04/15/2022  Attempts:  3rd attempt  Reason for unsuccessful TCM follow-up call:  Left voice message

## 2022-04-25 ENCOUNTER — Ambulatory Visit: Payer: Self-pay | Admitting: Surgery

## 2022-04-25 DIAGNOSIS — K8020 Calculus of gallbladder without cholecystitis without obstruction: Secondary | ICD-10-CM | POA: Diagnosis not present

## 2022-05-04 ENCOUNTER — Inpatient Hospital Stay: Payer: No Typology Code available for payment source | Admitting: Nurse Practitioner

## 2022-05-17 NOTE — Pre-Procedure Instructions (Signed)
Surgical Instructions    Your procedure is scheduled on May 25, 2022.  Report to [facility] Main Entrance "A" at 6:30 A.M., then check in with the Admitting office.  Call this number if you have problems the morning of surgery:  [phone]   If you have any questions prior to your surgery date call [phone]: Open Monday-Friday 8am-4pm    Remember:  Do not eat after midnight the night before your surgery  You may drink clear liquids until 5:30 AM the morning of your surgery.   Clear liquids allowed are: Water, Non-Citrus Juices (without pulp), Carbonated Beverages, Clear Tea, Black Coffee Only (NO MILK, CREAM OR POWDERED CREAMER of any kind), and Gatorade.     Take these medicines the morning of surgery with A SIP OF WATER:  acetaminophen (TYLENOL) - may take if needed    As of today, STOP taking any Aspirin (unless otherwise instructed by your surgeon) Aleve, Naproxen, Ibuprofen, Motrin, Advil, Goody's, BC's, all herbal medications, fish oil, and all vitamins.                     Do NOT Smoke (Tobacco/Vaping) for 24 hours prior to your procedure.  If you use a CPAP at night, you may bring your mask/headgear for your overnight stay.   Contacts, glasses, piercing's, hearing aid's, dentures or partials may not be worn into surgery, please bring cases for these belongings.    For patients admitted to the hospital, discharge time will be determined by your treatment team.   Patients discharged the day of surgery will not be allowed to drive home, and someone needs to stay with them for 24 hours.  SURGICAL WAITING ROOM VISITATION Patients having surgery or a procedure may have no more than 2 support people in the waiting area - these visitors may rotate.   Children under the age of 27 must have an adult with them who is not the patient. If the patient needs to stay at the hospital during part of their recovery, the visitor guidelines for inpatient rooms apply. Pre-op  nurse will coordinate an appropriate time for 1 support person to accompany patient in pre-op.  This support person may not rotate.   Please refer to the [facility] website for the visitor guidelines for Inpatients (after your surgery is over and you are in a regular room).    Special instructions:   [place]- Preparing For Surgery  Before surgery, you can play an important role. Because skin is not sterile, your skin needs to be as free of germs as possible. You can reduce the number of germs on your skin by washing with CHG (chlorahexidine gluconate) Soap before surgery.  CHG is an antiseptic cleaner which kills germs and bonds with the skin to continue killing germs even after washing.    Oral Hygiene is also important to reduce your risk of infection.  Remember - BRUSH YOUR TEETH THE MORNING OF SURGERY WITH YOUR REGULAR TOOTHPASTE  Please do not use if you have an allergy to CHG or antibacterial soaps. If your skin becomes reddened/irritated stop using the CHG.  Do not shave (including legs and underarms) for at least 48 hours prior to first CHG shower. It is OK to shave your face.  Please follow these instructions carefully.   Shower the NIGHT BEFORE SURGERY and the MORNING OF SURGERY  If you chose to wash your hair, wash your hair first as usual with your normal shampoo.  After you shampoo,  rinse your hair and body thoroughly to remove the shampoo.  Use CHG Soap as you would any other liquid soap. You can apply CHG directly to the skin and wash gently with a scrungie or a clean washcloth.   Apply the CHG Soap to your body ONLY FROM THE NECK DOWN.  Do not use on open wounds or open sores. Avoid contact with your eyes, ears, mouth and genitals (private parts). Wash Face and genitals (private parts)  with your normal soap.   Wash thoroughly, paying special attention to the area where your surgery will be performed.  Thoroughly rinse your body with warm water from the neck  down.  DO NOT shower/wash with your normal soap after using and rinsing off the CHG Soap.  Pat yourself dry with a CLEAN TOWEL.  Wear CLEAN PAJAMAS to bed the night before surgery  Place CLEAN SHEETS on your bed the night before your surgery  DO NOT SLEEP WITH PETS.   Day of Surgery: Take a shower with CHG soap. Do not wear jewelry or makeup Do not wear lotions, powders, perfumes/colognes, or deodorant. Do not shave 48 hours prior to surgery.  Men may shave face and neck. Do not bring valuables to the hospital.  [facility] is not responsible for any belongings or valuables. Do not wear nail polish, gel polish, artificial nails, or any other type of covering on natural nails (fingers and toes) If you have artificial nails or gel coating that need to be removed by a nail salon, please have this removed prior to surgery. Artificial nails or gel coating may interfere with anesthesia's ability to adequately monitor your vital signs.  Wear Clean/Comfortable clothing the morning of surgery Remember to brush your teeth WITH YOUR REGULAR TOOTHPASTE.   Please read over the following fact sheets that you were given.    If you received a COVID test during your pre-op visit  it is requested that you wear a mask when out in public, stay away from anyone that may not be feeling well and notify your surgeon if you develop symptoms. If you have been in contact with anyone that has tested positive in the last 10 days please notify you surgeon.

## 2022-05-18 ENCOUNTER — Encounter (HOSPITAL_COMMUNITY)
Admission: RE | Admit: 2022-05-18 | Discharge: 2022-05-18 | Disposition: A | Discharge status: Home / Self Care | Payer: No Typology Code available for payment source | Source: Ambulatory Visit | Attending: Surgery | Admitting: Surgery

## 2022-05-18 ENCOUNTER — Encounter (HOSPITAL_COMMUNITY): Payer: Self-pay

## 2022-05-18 ENCOUNTER — Other Ambulatory Visit: Payer: Self-pay

## 2022-05-18 VITALS — BP 141/93 | HR 74 | Temp 98.0°F | Resp 17 | Ht 68.0 in | Wt 210.3 lb

## 2022-05-18 DIAGNOSIS — Z01818 Encounter for other preprocedural examination: Secondary | ICD-10-CM | POA: Insufficient documentation

## 2022-05-18 HISTORY — DX: Gastro-esophageal reflux disease without esophagitis: K21.9

## 2022-05-18 LAB — CBC
HCT: 34.5 % — ABNORMAL LOW (ref 36.0–46.0)
Hemoglobin: 11.9 g/dL — ABNORMAL LOW (ref 12.0–15.0)
MCH: 31.2 pg (ref 26.0–34.0)
MCHC: 34.5 g/dL (ref 30.0–36.0)
MCV: 90.6 fL (ref 80.0–100.0)
Platelets: 371 10*3/uL (ref 150–400)
RBC: 3.81 MIL/uL — ABNORMAL LOW (ref 3.87–5.11)
RDW: 11.7 % (ref 11.5–15.5)
WBC: 4.1 10*3/uL (ref 4.0–10.5)
nRBC: 0 % (ref 0.0–0.2)

## 2022-05-18 NOTE — Progress Notes (Signed)
PCP - Flossie Buffy, NP Cardiologist - Denies  PPM/ICD - Denies Device Orders - n/a Rep Notified - n/a  Chest x-ray - 11/18/2011 EKG - 11/18/2011 - got work-up for CP. Work-Up normal. CP related to stress/GERD from [facility] recent passing. Stress Test - Denies ECHO - Denies Cardiac Cath - Denies  Sleep Study - Denies CPAP - n/a  No DM  Last dose of GLP1 agonist- n/a GLP1 instructions: n/a  Blood Thinner Instructions: n/a Aspirin Instructions: n/a  ERAS Protcol - Clear liquids until 0530 morning of surgery PRE-SURGERY Ensure or G2- n/a  COVID TEST- n/a   Anesthesia review: No.    Patient denies shortness of breath, fever, cough and chest pain at PAT appointment   All instructions explained to the patient, with a verbal understanding of the material. Patient agrees to go over the instructions while at home for a better understanding. Patient also instructed to self quarantine after being tested for COVID-19. The opportunity to ask questions was provided.

## 2022-05-18 NOTE — Progress Notes (Signed)
At beginning of PAT visit, pt had some questions regarding the surgery and post operative care. RN vocalized that Dr. Brantley Stage will have to answer those questions for her, but she can wait to sign consent form DOS so that she can get her questions answered when she see's him that morning. Pt voiced that after thinking about the surgery and doing some research, she is not sure she wants to still go through with the surgery. RN encouraged pt to discuss all this with Dr. Brantley Stage BEFORE her surgery day so that if she decides not to have surgery, she can cancel it prior to coming to hospital DOS. Pt still wished to continue with PAT visit in case she does decide to go through with the surgery on November 15th.  She was also contacted by [facility] requesting $3000 lump sum prior to surgery, but pt does not currently have the funds to pay all at once. Pt tried to contact billing but no one returned her call. RN encouraged pt to keep trying to get in contact with billing and ask to be placed on a payment plan. Pt understood all instructions given to her.

## 2022-05-25 ENCOUNTER — Ambulatory Visit (HOSPITAL_COMMUNITY)
Admission: RE | Admit: 2022-05-25 | Payer: No Typology Code available for payment source | Source: Home / Self Care | Admitting: Surgery

## 2022-05-25 ENCOUNTER — Encounter (HOSPITAL_COMMUNITY): Admission: RE | Payer: Self-pay | Source: Home / Self Care

## 2022-05-25 SURGERY — LAPAROSCOPIC CHOLECYSTECTOMY
Anesthesia: General

## 2022-06-15 ENCOUNTER — Inpatient Hospital Stay: Payer: No Typology Code available for payment source | Admitting: Nurse Practitioner

## 2023-01-18 NOTE — Progress Notes (Deleted)
NEUROLOGY FOLLOW UP OFFICE NOTE  Erin [name] [id number]  Assessment/Plan:   Migraine without aura, without status migrainosus, not intractable   1.  Migraine prevention:  Not indicated. 2.  Migraine rescue:  Rizatriptan 10mg  with Zofran if needed *** 3.  Limit use of pain relievers to no more than 2 days out of week to prevent risk of rebound or medication-overuse headache. 4.  Keep headache diary 5.  Follow up in 1 year. ***     Subjective:  Erin [name] is a 37 year old right-handed female with asthma and migraines who follows up for migraines.  Accompanied by her mother  UPDATE: Intensity:  moderate Duration:  30 to 60 minutes with rizatriptan. Frequency:  No migraines in June.  This year, she has had about 3 Current NSAIDS/analgesics:  Tylenol Current triptans:  rizatriptan 10mg  Current ergotamine:  none Current anti-emetic:  zofran 4mg  Current muscle relaxants:  none Current Antihypertensive medications:  none Current Antidepressant medications:  none Current Anticonvulsant medications:  none Current anti-CGRP:  none Current Vitamins/Herbal/Supplements:  none Current Antihistamines/Decongestants:  none Other therapy:  none Hormone/birth control:  none  Caffeine:  Cut back on caffeine Diet:  Drinks 1 L water daily.  No longer skipping meals.  Cut back on alcohol Exercise:  walks 10-15 minutes twice a week.  Says she needs to improve.   Depression:  No; Anxiety:  no Other pain:  no Sleep:  7 to 8 hours.  Goes to bed at 11 and wakes up at 7.  Feels rested during the day  HISTORY  Headache 1: Onset:  37 years old Location:  Frontal, right or left sided Quality:  pressure Intensity:  severe.  She denies new headache, thunderclap headache  Aura:  none Prodrome:  none Associated symptoms:  Nausea, photophobia, phonophobia and sometimes vomiting.  She denies associated unilateral numbness or weakness. Duration:  1 day Frequency:  Once a month (not  associated with menstruation).  Occurs if headache does not respond to OTC analgesics Triggers:  unknown Relieving factors:  Turning off the light, laying down in bed Activity:  Aggravates   Headache 2: Onset:  Around 37 years old Quality:  Pressure Intensity:  Moderate Location:  Behind both ears Duration:  6 hours Frequency: at least once a week Associated symptoms:  None       Past NSAIDS/analgesics:  Advil, Aleve, Excedrin Migraine Past abortive triptans:  none Past abortive ergotamine:  none Past muscle relaxants:  none Past anti-emetic:  none Past antihypertensive medications:  none Past antidepressant medications:  none Past anticonvulsant medications:  none Past anti-CGRP:  none Past vitamins/Herbal/Supplements:  none Past antihistamines/decongestants:  none Other past therapies:  none    Family history of headache:  No  PAST MEDICAL HISTORY: Past Medical History:  Diagnosis Date   ASCUS with positive high risk HPV 07/11/2009   Asthma    GERD (gastroesophageal reflux disease)    Migraine     MEDICATIONS: Current Outpatient Medications on File Prior to Visit  Medication Sig Dispense Refill   acetaminophen (TYLENOL) 325 MG tablet Take 650 mg by mouth every 6 (six) hours as needed for moderate pain.     promethazine (PHENERGAN) 12.5 MG tablet Take 1 tablet (12.5 mg total) by mouth every 6 (six) hours as needed for nausea or vomiting. (Patient not taking: Reported on 05/17/2022) 30 tablet 0   rizatriptan (MAXALT-MLT) 10 MG disintegrating tablet Take 1 tablet earliest onset of headache.  May repeat in 2 hours  if needed.  Maximum 2 tablets in 24 hours. (Patient not taking: Reported on 04/17/2022) 10 tablet 11   sucralfate (CARAFATE) 1 g tablet Take 1 tablet (1 g total) by mouth 4 (four) times daily -  with meals and at bedtime. (Patient not taking: Reported on 05/17/2022) 28 tablet 0   No current facility-administered medications on file prior to visit.     ALLERGIES: No Known Allergies  FAMILY HISTORY: Family History  Problem Relation Age of Onset   Hypertension Mother       Objective:  *** General: No acute distress.  Patient appears well-groomed.   Head:  Normocephalic/atraumatic Eyes:  Fundi examined but not visualized Neck: supple, no paraspinal tenderness, full range of motion Heart:  Regular rate and rhythm Neurological Exam: alert and oriented.  Speech fluent and not dysarthric, language intact.  CN II-XII intact. Bulk and tone normal, muscle strength 5/5 throughout.  Sensation to light touch intact.  Deep tendon reflexes 2+ throughout.  Finger to nose testing intact.  Gait normal, Romberg negative.   Shon Millet, DO  CC: Anne Ng, NP

## 2023-01-19 ENCOUNTER — Encounter: Payer: Self-pay | Admitting: Neurology

## 2023-01-19 ENCOUNTER — Ambulatory Visit: Payer: No Typology Code available for payment source | Admitting: Neurology

## 2023-01-19 DIAGNOSIS — Z029 Encounter for administrative examinations, unspecified: Secondary | ICD-10-CM

## 2023-05-25 ENCOUNTER — Ambulatory Visit (INDEPENDENT_AMBULATORY_CARE_PROVIDER_SITE_OTHER): Payer: No Typology Code available for payment source

## 2023-05-25 DIAGNOSIS — F411 Generalized anxiety disorder: Secondary | ICD-10-CM | POA: Diagnosis not present

## 2023-05-25 DIAGNOSIS — F331 Major depressive disorder, recurrent, moderate: Secondary | ICD-10-CM

## 2023-05-25 NOTE — Progress Notes (Addendum)
Comprehensive Clinical Assessment (CCA) Note  05/25/2023 Erin [name] [id number]  Chief Complaint:  Chief Complaint  Patient presents with   Depression    Wants medication for mood swings    Visit Diagnosis:  F32.1 Major Depressive Disorder, Recurrent, Moderate   CCA Screening, Triage and Referral (STR)  Patient Reported Information How did you hear about [place]? -- (found [place] on the site where her insurances listed [place] as a provider)  Referral name: No data recorded Referral phone number: No data recorded  Whom do you see for routine medical problems? -- (does not have a PCP but willing to call and get set up)  Practice/Facility Name: [facility]  Practice/Facility Phone Number: No data recorded Name of Contact: No data recorded Contact Number: No data recorded Contact Fax Number: No data recorded Prescriber Name: No data recorded Prescriber Address (if known): No data recorded  What Is the Reason for Your Visit/Call Today? No data recorded How Long Has This Been Causing You Problems? > than 6 months  What Do You Feel Would Help You the Most Today? Treatment for Depression or other mood problem   Have You Recently Been in Any Inpatient Treatment (Hospital/Detox/Crisis Center/28-Day Program)? No  Name/Location of Program/Hospital:No data recorded How Long Were You There? No data recorded When Were You Discharged? No data recorded  Have You Ever Received Services From [facility] Before? No  Who Do You See at [facility]? No data recorded  Have You Recently Had Any Thoughts About Hurting Yourself? No  Are You Planning to Commit Suicide/Harm Yourself At This time? No data recorded  Have you Recently Had Thoughts About Hurting Someone Erin [name]? No  Explanation: No data recorded  Have You Used Any Alcohol or Drugs in the Past 24 Hours? No  How Long Ago Did You Use Drugs or Alcohol? No data recorded What Did You Use and How Much? No data recorded  Do You  Currently Have a Therapist/Psychiatrist? No (would like one. Therapist will have front desk schedule her an appointment)  Name of Therapist/Psychiatrist: No data recorded  Have You Been Recently Discharged From Any Office Practice or Programs? No  Explanation of Discharge From Practice/Program: No data recorded    CCA Screening Triage Referral Assessment Type of Contact: Face-to-Face  Is this Initial or Reassessment? No data recorded Date Telepsych consult ordered in [facility]:  No data recorded Time Telepsych consult ordered in [facility]:  No data recorded  Patient Reported Information Reviewed? No data recorded Patient Left Without Being Seen? No data recorded Reason for Not Completing Assessment: No data recorded  Collateral Involvement: none   Does Patient Have a Court Appointed Legal Guardian? No data recorded Name and Contact of Legal Guardian: No data recorded If Minor and Not Living with Parent(s), Who has Custody? No data recorded Is Erin [name] involved or ever been involved? Never  Is Erin [name] involved or ever been involved? Never   Patient Determined To Be At Risk for Harm To Self or Others Based on Review of Patient Reported Information or Presenting Complaint? No  Method: No data recorded Availability of Means: No data recorded Intent: No data recorded Notification Required: No data recorded Additional Information for Danger to Others Potential: No data recorded Additional Comments for Danger to Others Potential: No data recorded Are There Guns or Other Weapons in Your Home? Yes (they are secured)  Types of Guns/Weapons: not sure. They were my Dad's guns.  Are These Weapons Safely Secured?  Yes  Who Could Verify You Are Able To Have These Secured: No data recorded Do You Have any Outstanding Charges, Pending Court Dates, Parole/Probation? no  Contacted To Inform of Risk of Harm To Self or Others: No data recorded  Location of Assessment: [facility]  Assessment Services   Does Patient Present under Involuntary Commitment? No  IVC Papers Initial File Date: No data recorded  [place] of Residence: [place]   Patient Currently Receiving the Following Services: No data recorded  Determination of Need: Routine (7 days)   Options For Referral: Outpatient Therapy     CCA Biopsychosocial Intake/Chief Complaint:  mood swings, depression, anxiety  Current Symptoms/Problems: Erin [name] presents today for a CCA.  She says she discovered Erin [name] was a provider approved by her insurance Administrator) and that is how she found Erin [name].  Erin [name] endorses the following depressive symptoms: depressed mood, anhedonia, fatigue./loss of energy, increase in appetite with no significant weight gain,, diminished ability to think/concentrate. Erin [name] circles the "very difficult" when noting how depressive symptoms have affected her functioning, noting she does not interact with her friends like she did prior to the onset of her depressive symptoms.   Erin [name] denies S/HI., or any previous ideations, plans or attempts. Erin [name] describes her depression as "getting better for a while and then getting worse". Erin [name] scored a 14 on the PHQ-9. Brysa reports the onset of the depressive symptoms to have been approximately one year ago.  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  Erin [name] reports she loves her job. Erin [name] also reports her step-father which is the only father she has known passed away from complications due to Parkinson's about 1 year ago and she feels she is still grieving this loss. Erin [name] currently lives with her mother who she describes is supportive.  Erin [name] reports the onset of her anxiety to have been about a year ago. She cites the same  psychosocial stressors that precipitated her anxiety. Erin [name] scored a 17 on the GAD-7.  She endorses anxiety symptoms that are congruent with Generalized Anxiety Disorder including the following: difficulty controlling worry, restless or on edge,fatigue, difficulty concentrating, irritability, muscle tension,. Lively describes the anxiety as being "very difficult" regarding how it has impacted her life.   Jajaira denies any substance use criteria. Tekela reports drinking an alcoholic beverage to two times per month  Patient Reported Schizophrenia/Schizoaffective Diagnosis in Past: No   Strengths: kind person, empathic  Preferences: therapy and medication  Abilities: kind, empathic   Type of Services Patient Feels are Needed: therapy, medication   Initial Clinical Notes/Concerns: No data recorded  Mental Health Symptoms Depression:   Change in energy/activity; Difficulty Concentrating; Increase/decrease in appetite; Irritability; Tearfulness; Weight gain/loss; Fatigue   Duration of Depressive symptoms:  Greater than two weeks   Mania:   None   Anxiety:    Difficulty concentrating; Fatigue; Irritability; Restlessness; Tension; Worrying (sleeps all day on Saturday but sleeps 6-8 other days)   Psychosis:   None   Duration of Psychotic symptoms: No data recorded  Trauma:   None   Obsessions:   None   Compulsions:   None   Inattention:   None   Hyperactivity/Impulsivity:   None   Oppositional/Defiant Behaviors:   None   Emotional Irregularity:   None   Other Mood/Personality Symptoms:  No data recorded   Mental Status Exam Appearance and self-care  Stature:   Average   Weight:   Average weight   Clothing:   Casual   Grooming:   Normal   Cosmetic use:   Age appropriate   Posture/gait:   Normal   Motor activity:   Not Remarkable   Sensorium  Attention:   Normal   Concentration:   Normal   Orientation:   X5   Recall/memory:   Normal    Affect and Mood  Affect:   Anxious   Mood:   Depressed   Relating  Eye contact:   Normal   Facial expression:   Sad   Attitude toward examiner:   Cooperative   Thought and Language  Speech flow:  Normal   Thought content:   Appropriate to Mood and Circumstances   Preoccupation:   None   Hallucinations:   None   Organization:  No data recorded  [employer] of Knowledge:   Average   Intelligence:   Average   Abstraction:   Abstract   Judgement:   Normal   Reality Testing:   Realistic   Insight:   Good   Decision Making:   Normal   Social Functioning  Social Maturity:   Responsible   Social Judgement:   Normal   Stress  Stressors:   Family conflict; Grief/losses; Work   Coping Ability:   Human resources officer Deficits:   Decision making   Supports:   Family; Friends/Service system     Religion: Religion/Spirituality Are You A Religious Person?: Yes What is Your Religious Affiliation?: Other (non denominational) How Might This Affect Treatment?: none  Leisure/Recreation: Leisure / Recreation Do You Have Hobbies?: No (used to have hobbie but mood interfered)  Exercise/Diet: Exercise/Diet Do You Exercise?: No Have You Gained or Lost A Significant Amount of Weight in the Past Six Months?: No Do You Follow a Special Diet?: No Do You Have Any Trouble Sleeping?: No (ony on Saturdays, as she sleeps all day on this day.)   CCA Employment/Education Employment/Work Situation: Employment / Work Situation Employment Situation: Employed 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 Current job has been employed since 2015 Do You Work More Than One Job?: Yes Work Stressors: she has only one Haematologist, the maintenace person who recently resigned What is the Longest Time Patient has Held a Job?: 8 years Where was the Patient Employed at that Time?:  same job Has Patient ever Been in the [employer]?: No  Education: Education Is Patient Currently Attending School?: No Last Grade Completed: 12 Name of High School: [employer] Did You Graduate From [employer]?: Yes Did You Attend College?: Yes What Type of College Degree Do you Have?: none went to [employer] and had one more class but did not finish. Was studying business Did Designer, television/film set?: No Did You Have Any Special Interests In School?: enjoyed psychology class Did You Have An Individualized Education Program (IIEP): No Did You Have Any Difficulty At School?: Yes (Had trouble focusing) Were Any Medications Ever Prescribed For These Difficulties?: No Patient's Education Has Been Impacted by Current Illness: Yes How Does Current Illness Impact Education?: is not sure if she could have completed the degree, but had difficulty focusing. Reports the sciences were difficulty   CCA Family/Childhood History Family and Relationship History: Family history Marital status: Single Are you sexually active?: Yes What  is your sexual orientation?: heter Has your sexual activity been affected by drugs, alcohol, medication, or emotional stress?: no Does patient have children?: No  Childhood History:  Childhood History By whom was/is the patient raised?: Both parents Description of patient's relationship with caregiver when they were a child: father is step father. "He married my mother when I was [age]"  Good relationships Patient's description of current relationship with people who raised him/her: good, healthy with mother. Step father passed in May 2024 due to complications from Parkinsons. How were you disciplined when you got in trouble as a child/adolescent?: spanked, can count on hand how many. Does patient have siblings?: Yes Number of Siblings: 5 (4 are step brothers. 1 is my biological brother) Description of patient's current relationship with siblings: not really  close with any of them Did patient suffer any verbal/emotional/physical/sexual abuse as a child?: No Did patient suffer from severe childhood neglect?: No Has patient ever been sexually abused/assaulted/raped as an adolescent or adult?: No Was the patient ever a victim of a crime or a disaster?: No Witnessed domestic violence?: No Has patient been affected by domestic violence as an adult?: No  Child/Adolescent Assessment:     CCA Substance Use Alcohol/Drug Use: Alcohol / Drug Use Pain Medications: none Over the Counter: may take  a tynelol if has pain History of alcohol / drug use?: No history of alcohol / drug abuse                        ASAM's:  Six Dimensions of Multidimensional Assessment  Dimension 1:  Acute Intoxication and/or Withdrawal Potential:      Dimension 2:  Biomedical Conditions and Complications:      Dimension 3:  Emotional, Behavioral, or Cognitive Conditions and Complications:     Dimension 4:  Readiness to Change:     Dimension 5:  Relapse, Continued use, or Continued Problem Potential:     Dimension 6:  Recovery/Living Environment:     ASAM Severity Score:    ASAM Recommended Level of Treatment:     Substance use Disorder (SUD)    Recommendations for Services/Supports/Treatments:    DSM5 Diagnoses: Patient Active Problem List   Diagnosis Date Noted   Family history of pulmonary embolism 07/01/2021   Asthma 01/03/2017   Vaginal discharge 12/14/2015   Obesity (BMI 30.0-34.9) 12/14/2015   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  Appointments: 06/16/23 Dr. Gloris Manchester,  LCSW 07/17/2023  Note: It is unclear if this [place] has Community education officer for  behavioral health services.  She did not present an insurance card, per front desk. Front desk also said that [place] only has Community education officer for personal and family and not behavioral health.  A call will be made to [place] to discover her coverage.   Referrals to Alternative Service(s): Referred to Alternative Service(s):   Place:   Date:   Time:    Referred to Alternative Service(s):   Place:   Date:   Time:    Referred to Alternative Service(s):   Place:   Date:   Time:    Referred to Alternative Service(s):   Place:   Date:   Time:      Collaboration of Care: N/A  Patient/Guardian  was advised Release of Information must be obtained prior to any record release in order to collaborate their care with an outside provider. Patient/Guardian was advised if they have not already done so to contact the registration department to sign all necessary forms in order for [place] to release information regarding their care.   Consent: Patient/Guardian gives verbal consent for treatment and assignment of benefits for services provided during this visit. Patient/Guardian expressed understanding and agreed to proceed.   Remigio Eisenmenger, MS, LMFT, LCAS 05-25-23
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
## 2023-06-16 ENCOUNTER — Ambulatory Visit (INDEPENDENT_AMBULATORY_CARE_PROVIDER_SITE_OTHER): Payer: No Typology Code available for payment source | Admitting: Psychiatry

## 2023-06-16 ENCOUNTER — Encounter (HOSPITAL_COMMUNITY): Payer: Self-pay | Admitting: Psychiatry

## 2023-06-16 DIAGNOSIS — F411 Generalized anxiety disorder: Secondary | ICD-10-CM | POA: Diagnosis not present

## 2023-06-16 DIAGNOSIS — F331 Major depressive disorder, recurrent, moderate: Secondary | ICD-10-CM

## 2023-06-16 DIAGNOSIS — F321 Major depressive disorder, single episode, moderate: Secondary | ICD-10-CM

## 2023-06-16 DIAGNOSIS — F329 Major depressive disorder, single episode, unspecified: Secondary | ICD-10-CM | POA: Diagnosis not present

## 2023-06-16 MED ORDER — FLUOXETINE HCL 10 MG PO CAPS: ORAL_CAPSULE | ORAL | 1 refills | Status: AC

## 2023-06-16 NOTE — Patient Instructions (Signed)
Thank you for attending your appointment today.  -- START Prozac 10 mg daily. After 1 week, INCREASE to 20 mg daily. -- Continue other medications as prescribed.  Please do not make any changes to medications without first discussing with your provider. If you are experiencing a psychiatric emergency, please call 911 or present to your nearest emergency department. Additional crisis, medication management, and therapy resources are included below.  [facility]  [address], [place], [place] [address] [phone] WALK-IN URGENT CARE 24/7 FOR ANYONE [address], [place], [place]  [phone] Fax: [phone] [email or web address] *Interpreters available *Accepts all insurance and uninsured for Urgent Care needs *Accepts Medicaid and uninsured for outpatient treatment (below)      ONLY FOR [facility]  Below:    Outpatient New Patient Assessment/Therapy Walk-ins:        Monday, Wednesday, and Thursday 8am until slots are full (first come, first served)                   New Patient Psychiatry/Medication Management        Monday-Friday 8am-11am (first come, first served)               For all walk-ins we ask that you arrive by 7:15am, because patients will be seen in the order of arrival.

## 2023-06-18 DIAGNOSIS — F411 Generalized anxiety disorder: Secondary | ICD-10-CM | POA: Insufficient documentation

## 2023-06-18 DIAGNOSIS — F321 Major depressive disorder, single episode, moderate: Secondary | ICD-10-CM | POA: Insufficient documentation

## 2023-06-26 ENCOUNTER — Other Ambulatory Visit (HOSPITAL_COMMUNITY): Payer: No Typology Code available for payment source

## 2023-06-26 DIAGNOSIS — Z79899 Other long term (current) drug therapy: Secondary | ICD-10-CM | POA: Diagnosis not present

## 2023-06-26 NOTE — Progress Notes (Signed)
Pt tolerated labs well.

## 2023-06-29 ENCOUNTER — Other Ambulatory Visit (HOSPITAL_COMMUNITY): Payer: Self-pay | Admitting: Psychiatry

## 2023-06-29 DIAGNOSIS — F321 Major depressive disorder, single episode, moderate: Secondary | ICD-10-CM | POA: Diagnosis not present

## 2023-06-29 DIAGNOSIS — F411 Generalized anxiety disorder: Secondary | ICD-10-CM | POA: Diagnosis not present

## 2023-06-30 LAB — CBC WITH DIFFERENTIAL/PLATELET
Hematocrit: 41.4 % (ref 34.0–46.6)
Hemoglobin: 12.8 g/dL (ref 11.1–15.9)
MCH: 29.9 pg (ref 26.6–33.0)
MCHC: 30.9 g/dL — ABNORMAL LOW (ref 31.5–35.7)
MCV: 97 fL (ref 79–97)
Platelets: 317 10*3/uL (ref 150–450)
RBC: 4.28 x10E6/uL (ref 3.77–5.28)
RDW: 11.5 % — ABNORMAL LOW (ref 11.7–15.4)
WBC: 3.6 10*3/uL (ref 3.4–10.8)

## 2023-06-30 LAB — COMPREHENSIVE METABOLIC PANEL
ALT: 18 [IU]/L (ref 0–32)
AST: 17 [IU]/L (ref 0–40)
Albumin: 4.5 g/dL (ref 3.9–4.9)
Alkaline Phosphatase: 61 [IU]/L (ref 44–121)
BUN/Creatinine Ratio: 7 — ABNORMAL LOW (ref 9–23)
BUN: 6 mg/dL (ref 6–20)
Bilirubin Total: 0.3 mg/dL (ref 0.0–1.2)
CO2: 19 mmol/L — ABNORMAL LOW (ref 20–29)
Calcium: 8.9 mg/dL (ref 8.7–10.2)
Chloride: 101 mmol/L (ref 96–106)
Creatinine, Ser: 0.82 mg/dL (ref 0.57–1.00)
Globulin, Total: 2.8 g/dL (ref 1.5–4.5)
Glucose: 71 mg/dL (ref 70–99)
Potassium: 4.3 mmol/L (ref 3.5–5.2)
Sodium: 141 mmol/L (ref 134–144)
Total Protein: 7.3 g/dL (ref 6.0–8.5)
eGFR: 96 mL/min/{1.73_m2} (ref >59)

## 2023-06-30 LAB — SPECIMEN STATUS REPORT

## 2023-06-30 LAB — VITAMIN D 25 HYDROXY (VIT D DEFICIENCY, FRACTURES): Vit D, 25-Hydroxy: 9.6 ng/mL — ABNORMAL LOW (ref 30.0–100.0)

## 2023-06-30 LAB — VITAMIN B12: Vitamin B-12: 206 pg/mL — ABNORMAL LOW (ref 232–1245)

## 2023-06-30 LAB — TSH: TSH: 1.47 u[IU]/mL (ref 0.450–4.500)

## 2023-07-03 DIAGNOSIS — B9689 Other specified bacterial agents as the cause of diseases classified elsewhere: Secondary | ICD-10-CM | POA: Diagnosis not present

## 2023-07-03 DIAGNOSIS — N760 Acute vaginitis: Secondary | ICD-10-CM | POA: Diagnosis not present

## 2023-07-03 DIAGNOSIS — Z202 Contact with and (suspected) exposure to infections with a predominantly sexual mode of transmission: Secondary | ICD-10-CM | POA: Diagnosis not present

## 2023-07-27 ENCOUNTER — Ambulatory Visit (HOSPITAL_COMMUNITY): Payer: Self-pay | Admitting: Licensed Clinical Social Worker

## 2023-07-27 DIAGNOSIS — F321 Major depressive disorder, single episode, moderate: Secondary | ICD-10-CM

## 2023-07-27 DIAGNOSIS — F6381 Intermittent explosive disorder: Secondary | ICD-10-CM | POA: Insufficient documentation

## 2023-07-27 DIAGNOSIS — F411 Generalized anxiety disorder: Secondary | ICD-10-CM
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
## 2023-08-14 ENCOUNTER — Telehealth (HOSPITAL_COMMUNITY): Payer: Self-pay | Admitting: Psychiatry

## 2023-08-14 ENCOUNTER — Encounter (HOSPITAL_COMMUNITY): Payer: Self-pay

## 2023-08-28 ENCOUNTER — Ambulatory Visit (HOSPITAL_COMMUNITY): Payer: Self-pay | Admitting: Licensed Clinical Social Worker

## 2023-09-18 ENCOUNTER — Ambulatory Visit (INDEPENDENT_AMBULATORY_CARE_PROVIDER_SITE_OTHER): Payer: Self-pay | Admitting: Licensed Clinical Social Worker

## 2023-09-18 DIAGNOSIS — F6381 Intermittent explosive disorder: Secondary | ICD-10-CM | POA: Diagnosis not present

## 2023-09-18 NOTE — Progress Notes (Signed)
 THERAPIST PROGRESS NOTE  Session Time: 31   Participation Level: Active  Behavioral Response: CasualAlertAnxious and Depressed  Type of Therapy: Individual Therapy  Treatment Goals addressed:  Active     Anger Management     LTG: Genevie will reduce the amount of anger-related incidents/outbursts by 50% as evidenced by self-report     Start:  07/27/23    Expected End:  02/08/24         STG: Nadie will identify situations, thoughts, and feelings that trigger internal anger, and/or angry/aggressive actions as evidenced by self-report     Start:  07/27/23    Expected End:  02/08/24         STG: Tanita will complete at least 80% of assigned homework      Start:  07/27/23    Expected End:  02/08/24         identify 3 trigger for anger      Start:  07/27/23    Expected End:  02/08/24         identify 3 coping skills for anger      Start:  07/27/23    Expected End:  02/08/24         Work with Vernona Rieger to track symptoms, triggers, and/or skill use through a mood chart, diary card, or journal     Start:  07/27/23         Encourage Lorrine to participate in recovery peer support activities      Start:  07/27/23         Educate Vernona Rieger on anger management skills and the rationale for learning these skills     Start:  07/27/23         Instruct Liese to research educational material on anger management     Start:  07/27/23         Educate Vernona Rieger on internal and external reinforcers of anger response and the rationale for identifying reinforcers of angry responses     Start:  07/27/23         Educate Vernona Rieger on relaxation techniques and the rationale for learning these techniques     Start:  07/27/23         Encourage Lousie to practice relaxation skills at home for 5 minutes, 1 time per day, for 3 days     Start:  07/27/23            ProgressTowards Goals: Progressing  Interventions: CBT, Motivational Interviewing, and Supportive  Suicidal/Homicidal:  Nowithout intent/plan  Therapist Response:     Patient was alert and oriented x 5.  He was pleasant, cooperative, maintained good eye contact.  Patient engaged well in therapy session and was dressed casually.   LCSW and more spoke today about no-show policy.  Patient has records indicating a no-show x 2 in a row one for medication provider and one for LCSW.  LCSW spoke with patient about policy and procedure for no-shows in 1 year.  LCSW notified her that 3 no-shows would result in walking appointments.  Patient reports frustration since she has received text messages and attempted to cancel the appointments via text message, but it states that you still need to call.  Patient reports that she has called but then it is within 24 hours as she must call within business hours.  LCSW validated her frustrations but stated that it is patient's responsibility to cancel appointments.  LCSW advised patient that complaint would be passed on to his supervisor to review  cancellation text messages.  LCSW also advised patient to leave voicemails with front desk in front desk could not be reached his voicemails will be time stamps.  Keerthana was satisfied with LCSW's response.  Patient reports today that she has been getting through things.  She reports that she has been journaling more which has helped and tried to be more organized.  This has decreased her symptoms for irritability and mood swings.  Patient reports her primary stressor as work as she is by herself.  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  Patient states that on average she works 50 hours/week.  She reports stresses to employees have quit in the past 6 weeks.  Patient reports today that she would like to work on time management.  Intervention/plan: LCSW spoke with patient about organization such as utilizing a planner.  LCSW educated patient on utilizing a Chief Executive Officer.  LCSW provided patient worksheet for waking up  refreshed.  LCSW spoke with patient today about preparing things at night for her mornings to save time. LCSW utilized CBT for cognitive restructuring. LCSW used supportive therapy for praise and encouragement.   Plan: Return again in 3 weeks.  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 Patient/Guardian was advised if they have not already done so to contact the registration department to sign all necessary forms in order for [place] to release information regarding their care.   Consent: Patient/Guardian gives verbal consent for treatment and assignment of benefits for services provided during this visit. Patient/Guardian expressed understanding and agreed to proceed.   Weber Cooks, LCSW 09/18/2023
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
## 2023-10-16 ENCOUNTER — Encounter (HOSPITAL_COMMUNITY): Payer: Self-pay | Admitting: Psychiatry

## 2023-10-16 ENCOUNTER — Telehealth (INDEPENDENT_AMBULATORY_CARE_PROVIDER_SITE_OTHER): Payer: Self-pay | Admitting: Psychiatry

## 2023-10-16 DIAGNOSIS — F321 Major depressive disorder, single episode, moderate: Secondary | ICD-10-CM

## 2023-10-16 DIAGNOSIS — F411 Generalized anxiety disorder: Secondary | ICD-10-CM

## 2023-10-16 NOTE — Patient Instructions (Signed)
 Thank you for attending your appointment today.  -- We did not start any psychiatric medications today. As discussed, please schedule follow up with your primary care doctor for further monitoring and management of your low Vitamin D and B12.   Please do not make any changes to medications without first discussing with your provider. If you are experiencing a psychiatric emergency, please call 911 or present to your nearest emergency department. Additional crisis, medication management, and therapy resources are included below.  [facility]  [address] [place], [place] [address] [phone] WALK-IN URGENT CARE 24/7 FOR ANYONE [address] [place], [place]  [phone] Fax: [phone] [email or web address] *Interpreters available *Accepts all insurance and uninsured for Urgent Care needs *Accepts Medicaid and uninsured for outpatient treatment (below)      ONLY FOR [facility]  Below:    Outpatient New Patient Assessment/Therapy Walk-ins:        Monday, Wednesday, and Thursday 8am until slots are full (first come, first served)                   New Patient Psychiatry/Medication Management        Monday-Friday 8am-11am (first come, first served)               For all walk-ins we ask that you arrive by 7:15am, because patients will be seen in the order of arrival.

## 2023-11-03 ENCOUNTER — Telehealth: Payer: Self-pay | Admitting: Nurse Practitioner

## 2023-11-03 NOTE — Telephone Encounter (Signed)
 Lvmtcb if needing appt.
